# Patient Record
Sex: Female | Born: 1980 | Race: White | Hispanic: No | Marital: Married | Smoking: Never smoker
Health system: Southern US, Community
[De-identification: ages and names within clinical notes are randomized; demographics above are authoritative.]

## PROBLEM LIST (undated history)

## (undated) ENCOUNTER — Inpatient Hospital Stay (HOSPITAL_COMMUNITY): Payer: Self-pay

## (undated) DIAGNOSIS — Z8619 Personal history of other infectious and parasitic diseases: Secondary | ICD-10-CM

## (undated) DIAGNOSIS — I10 Essential (primary) hypertension: Secondary | ICD-10-CM

## (undated) SURGERY — Surgical Case
Anesthesia: *Unknown

---

## 2005-03-26 ENCOUNTER — Other Ambulatory Visit: Admission: RE | Admit: 2005-03-26 | Discharge: 2005-03-26 | Payer: Self-pay | Admitting: Obstetrics and Gynecology

## 2011-03-12 LAB — OB RESULTS CONSOLE RUBELLA ANTIBODY, IGM: Rubella: IMMUNE

## 2011-03-12 LAB — OB RESULTS CONSOLE HIV ANTIBODY (ROUTINE TESTING): HIV: NONREACTIVE

## 2011-03-12 LAB — OB RESULTS CONSOLE ABO/RH: RH Type: POSITIVE

## 2011-03-12 LAB — OB RESULTS CONSOLE RPR: RPR: NONREACTIVE

## 2011-08-26 ENCOUNTER — Inpatient Hospital Stay (HOSPITAL_COMMUNITY)
Admission: AD | Admit: 2011-08-26 | Discharge: 2011-08-26 | Disposition: A | Discharge status: Home / Self Care | Payer: BC Managed Care – PPO | Source: Ambulatory Visit | Attending: Obstetrics and Gynecology | Admitting: Obstetrics and Gynecology

## 2011-08-26 ENCOUNTER — Encounter (HOSPITAL_COMMUNITY): Payer: Self-pay | Admitting: *Deleted

## 2011-08-26 DIAGNOSIS — O139 Gestational [pregnancy-induced] hypertension without significant proteinuria, unspecified trimester: Secondary | ICD-10-CM

## 2011-08-26 DIAGNOSIS — IMO0002 Reserved for concepts with insufficient information to code with codable children: Secondary | ICD-10-CM

## 2011-08-26 DIAGNOSIS — R42 Dizziness and giddiness: Secondary | ICD-10-CM | POA: Insufficient documentation

## 2011-08-26 DIAGNOSIS — H538 Other visual disturbances: Secondary | ICD-10-CM | POA: Insufficient documentation

## 2011-08-26 DIAGNOSIS — R51 Headache: Secondary | ICD-10-CM | POA: Insufficient documentation

## 2011-08-26 HISTORY — DX: Essential (primary) hypertension: I10

## 2011-08-26 LAB — GLUCOSE, CAPILLARY: Glucose-Capillary: 86 mg/dL (ref 70–99)

## 2011-08-26 LAB — COMPREHENSIVE METABOLIC PANEL
Alkaline Phosphatase: 132 U/L — ABNORMAL HIGH (ref 39–117)
BUN: 10 mg/dL (ref 6–23)
Chloride: 102 mEq/L (ref 96–112)
GFR calc Af Amer: >90 mL/min (ref >90)
Glucose, Bld: 77 mg/dL (ref 70–99)
Potassium: 3.8 mEq/L (ref 3.5–5.1)
Total Bilirubin: 0.4 mg/dL (ref 0.3–1.2)

## 2011-08-26 LAB — URINALYSIS, ROUTINE W REFLEX MICROSCOPIC
Glucose, UA: NEGATIVE mg/dL
Hgb urine dipstick: NEGATIVE
Specific Gravity, Urine: 1.01 (ref 1.005–1.030)

## 2011-08-26 LAB — URIC ACID: Uric Acid, Serum: 7.4 mg/dL — ABNORMAL HIGH (ref 2.4–7.0)

## 2011-08-26 LAB — CBC
HCT: 33.3 % — ABNORMAL LOW (ref 36.0–46.0)
Hemoglobin: 11.3 g/dL — ABNORMAL LOW (ref 12.0–15.0)
WBC: 7.8 10*3/uL (ref 4.0–10.5)

## 2011-08-26 MED ORDER — ACETAMINOPHEN 325 MG PO TABS
650.0000 mg | ORAL_TABLET | Freq: Once | ORAL | Status: AC
  Administered 2011-08-26: 650 mg via ORAL
  Filled 2011-08-26: qty 2

## 2011-08-26 NOTE — MAU Note (Signed)
BP was 140/100 when checked by EMS.  Is on BP medicine- last taken 05/27 in the afternoon.

## 2011-08-26 NOTE — Discharge Instructions (Signed)
Hypertension During Pregnancy Hypertension is also called high blood pressure. Blood pressure moves blood in your body. Sometimes, the force that moves the blood becomes too strong. When you are pregnant, this condition should be watched carefully. It can cause problems for you and your baby. HOME CARE   Make and keep all of your doctor visits.   Take medicine as told by your doctor. Tell your doctor about all medicines you take.   Eat very little salt.   Exercise regularly.   Do not drink alcohol.   Do not smoke.   Do not have drinks with caffeine.   Lie on your left side when resting.  GET HELP RIGHT AWAY IF:  You have bad belly (abdominal) pain.   You have sudden puffiness (swelling) in the hands, ankles, or face.   You gain 4 pounds (1.8 kilograms) or more in 1 week.   You throw up (vomit) repeatedly.   You have bleeding from the vagina.   You do not feel the baby moving as much.   You have a headache.   You have blurred or double vision.   You have muscle twitching or spasms.   You have shortness of breath.   You have blue fingernails and lips.   You have blood in your pee (urine).  MAKE SURE YOU:  Understand these instructions.   Will watch your condition.   Will get help right away if you are not doing well.  Document Released: 04/19/2010 Document Revised: 03/06/2011 Document Reviewed: 11/01/2010 [employer] Patient Information 2012 [employer]. 

## 2011-08-26 NOTE — MAU Note (Addendum)
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  EMS called BS was 79.  Instructed to call MD.  Has a really bad headache, rt hand and lip felt numb- no longer do. No longer feeling dizzy,  Vision is much better.  At this point mainly the headache. Denies hx of diabetes or hypoglycemia.

## 2011-08-26 NOTE — MAU Provider Note (Addendum)
History     CSN: [id number]  Arrival date and time: 08/26/11 1239   None     Chief Complaint  Patient presents with  . Headache  . Dizziness  . Hypoglycemia   HPI This is a 31 year old G2 P0 010 at 33 weeks and one-day who presents the MAU with headache, blurred vision, elevated blood pressure that occurred around 10:30 this morning. EMS was called and she was found to have a blood pressure of 140/100 the blood sugar of 79. Her doctor's office was subsequently called and the patient was sent to the MAU for evaluation. Since being here, the patient's headache has continued, although her blurred vision has resolved. She reports good fetal activity and denies vaginal bleeding, vaginal discharge, leaking fluid, contractions, nausea, abdominal pain. Tylenol improved a headache. No provoking factors.  OB History    Grav Para Term Preterm Abortions TAB SAB Ect Mult Living   2         0      Past Medical History  Diagnosis Date  . Hypertension     History reviewed. No pertinent past surgical history.  Family History  Problem Relation Age of Onset  . Hypertension Mother   . Hypertension Father   . Cancer Father   . Heart disease Maternal Grandmother   . Heart disease Paternal Grandfather     History  Substance Use Topics  . Smoking status: Never Smoker   . Smokeless tobacco: Never Used  . Alcohol Use: No    Allergies: No Known Allergies  Prescriptions prior to admission  Medication Sig Dispense Refill  . NIFEdipine (PROCARDIA XL/ADALAT-CC) 60 MG 24 hr tablet Take 60 mg by mouth daily.      . Prenatal Vit-Fe Fumarate-FA (PRENATAL MULTIVITAMIN) TABS Take 1 tablet by mouth daily.        Review of Systems  All other systems reviewed and are negative.   Physical Exam   Blood pressure 145/96, pulse 64, temperature 98.7 F (37.1 C), temperature source Oral, resp. rate 20, height 5\' 4"  (1.626 m), weight 82.736 kg (182 lb 6.4 oz).  Physical Exam  Constitutional: She is  oriented to person, place, and time. She appears well-developed and well-nourished.  HENT:  Head: Normocephalic and atraumatic.  Eyes: EOM are normal.  Neck: Neck supple.  GI: Soft. Bowel sounds are normal. She exhibits no distension and no mass. There is no tenderness. There is no rebound and no guarding.       Size equal dates.  Neurological: She is alert and oriented to person, place, and time. No cranial nerve deficit.  Psychiatric: She has a normal mood and affect. Her behavior is normal. Judgment and thought content normal.   Results for orders placed during the hospital encounter of 08/26/11 (from the past 24 hour(s))  URINALYSIS, ROUTINE W REFLEX MICROSCOPIC     Status: Abnormal   Collection Time   08/26/11 12:40 PM      Component Value Range   Color, Urine YELLOW  YELLOW    APPearance CLEAR  CLEAR    Specific Gravity, Urine 1.010  1.005 - 1.030    pH 6.0  5.0 - 8.0    Glucose, UA NEGATIVE  NEGATIVE (mg/dL)   Hgb urine dipstick NEGATIVE  NEGATIVE    Bilirubin Urine NEGATIVE  NEGATIVE    Ketones, ur 15 (*) NEGATIVE (mg/dL)   Protein, ur NEGATIVE  NEGATIVE (mg/dL)   Urobilinogen, UA 0.2  0.0 - 1.0 (mg/dL)   Nitrite NEGATIVE  NEGATIVE    Leukocytes, UA NEGATIVE  NEGATIVE   GLUCOSE, CAPILLARY     Status: Normal   Collection Time   08/26/11 12:51 PM      Component Value Range   Glucose-Capillary 86  70 - 99 (mg/dL)  CBC     Status: Abnormal   Collection Time   08/26/11  1:33 PM      Component Value Range   WBC 7.8  4.0 - 10.5 (K/uL)   RBC 3.82 (*) 3.87 - 5.11 (MIL/uL)   Hemoglobin 11.3 (*) 12.0 - 15.0 (g/dL)   HCT 47.8 (*) 29.5 - 46.0 (%)   MCV 87.2  78.0 - 100.0 (fL)   MCH 29.6  26.0 - 34.0 (pg)   MCHC 33.9  30.0 - 36.0 (g/dL)   RDW 62.1  30.8 - 65.7 (%)   Platelets 224  150 - 400 (K/uL)  COMPREHENSIVE METABOLIC PANEL     Status: Abnormal   Collection Time   08/26/11  1:33 PM      Component Value Range   Sodium 134 (*) 135 - 145 (mEq/L)   Potassium 3.8  3.5 - 5.1  (mEq/L)   Chloride 102  96 - 112 (mEq/L)   CO2 22  19 - 32 (mEq/L)   Glucose, Bld 77  70 - 99 (mg/dL)   BUN 10  6 - 23 (mg/dL)   Creatinine, Ser 8.46  0.50 - 1.10 (mg/dL)   Calcium 9.0  8.4 - 96.2 (mg/dL)   Total Protein 5.7 (*) 6.0 - 8.3 (g/dL)   Albumin 2.6 (*) 3.5 - 5.2 (g/dL)   AST 18  0 - 37 (U/L)   ALT 11  0 - 35 (U/L)   Alkaline Phosphatase 132 (*) 39 - 117 (U/L)   Total Bilirubin 0.4  0.3 - 1.2 (mg/dL)   GFR calc non Af Amer >90  >90 (mL/min)   GFR calc Af Amer >90  >90 (mL/min)  URIC ACID     Status: Abnormal   Collection Time   08/26/11  1:33 PM      Component Value Range   Uric Acid, Serum 7.4 (*) 2.4 - 7.0 (mg/dL)  LACTATE DEHYDROGENASE     Status: Normal   Collection Time   08/26/11  1:33 PM      Component Value Range   LDH 185  94 - 250 (U/L)    MAU Course  Procedures NST: Category 1 tracing with baseline rate in the 130s. Multiple accelerations seen. No contractions evident.  MDM Uric acid elevated at 7.4. All the values normal. Some elevated concern for preeclampsia.   Assessment and Plan  #1 pregnancy-induced hypertension With elevated concern for eclampsia, patient sent home with 24-hour urine. Patient to followup with private physician's office on Friday. Call Dr. Henderson Cloud discussed plan. Patient to call if any further headaches, blurred vision, scotomata.  Courtnei Ruddell JEHIEL 08/26/2011, 1:38 PM

## 2011-09-02 ENCOUNTER — Other Ambulatory Visit (HOSPITAL_COMMUNITY): Payer: Self-pay | Admitting: Obstetrics and Gynecology

## 2011-09-02 DIAGNOSIS — I10 Essential (primary) hypertension: Secondary | ICD-10-CM

## 2011-09-10 ENCOUNTER — Other Ambulatory Visit (HOSPITAL_COMMUNITY): Payer: Self-pay | Admitting: Obstetrics & Gynecology

## 2011-09-10 ENCOUNTER — Ambulatory Visit (HOSPITAL_COMMUNITY)
Admission: RE | Admit: 2011-09-10 | Discharge: 2011-09-10 | Disposition: A | Discharge status: Home / Self Care | Payer: BC Managed Care – PPO | Source: Ambulatory Visit | Attending: Obstetrics and Gynecology | Admitting: Obstetrics and Gynecology

## 2011-09-10 DIAGNOSIS — I10 Essential (primary) hypertension: Secondary | ICD-10-CM

## 2011-09-10 DIAGNOSIS — O10019 Pre-existing essential hypertension complicating pregnancy, unspecified trimester: Secondary | ICD-10-CM | POA: Insufficient documentation

## 2011-09-10 DIAGNOSIS — Z3689 Encounter for other specified antenatal screening: Secondary | ICD-10-CM | POA: Insufficient documentation

## 2011-09-11 ENCOUNTER — Other Ambulatory Visit (HOSPITAL_COMMUNITY): Payer: Self-pay | Admitting: Obstetrics & Gynecology

## 2011-09-11 ENCOUNTER — Ambulatory Visit (HOSPITAL_COMMUNITY)
Admission: RE | Admit: 2011-09-11 | Discharge: 2011-09-11 | Disposition: A | Discharge status: Home / Self Care | Payer: BC Managed Care – PPO | Source: Ambulatory Visit | Attending: Obstetrics & Gynecology | Admitting: Obstetrics & Gynecology

## 2011-09-11 DIAGNOSIS — O4100X0 Oligohydramnios, unspecified trimester, not applicable or unspecified: Secondary | ICD-10-CM | POA: Insufficient documentation

## 2011-09-11 DIAGNOSIS — I10 Essential (primary) hypertension: Secondary | ICD-10-CM

## 2011-09-11 DIAGNOSIS — O10019 Pre-existing essential hypertension complicating pregnancy, unspecified trimester: Secondary | ICD-10-CM | POA: Insufficient documentation

## 2011-09-22 LAB — OB RESULTS CONSOLE GBS: GBS: NEGATIVE

## 2011-09-24 ENCOUNTER — Inpatient Hospital Stay (HOSPITAL_COMMUNITY)
Admission: AD | Admit: 2011-09-24 | Discharge: 2011-09-24 | Disposition: A | Discharge status: Home / Self Care | Payer: BC Managed Care – PPO | Source: Ambulatory Visit | Attending: Obstetrics and Gynecology | Admitting: Obstetrics and Gynecology

## 2011-09-24 ENCOUNTER — Encounter (HOSPITAL_COMMUNITY): Payer: Self-pay | Admitting: *Deleted

## 2011-09-24 DIAGNOSIS — O10019 Pre-existing essential hypertension complicating pregnancy, unspecified trimester: Secondary | ICD-10-CM | POA: Insufficient documentation

## 2011-09-24 DIAGNOSIS — O10919 Unspecified pre-existing hypertension complicating pregnancy, unspecified trimester: Secondary | ICD-10-CM

## 2011-09-24 LAB — CBC
HCT: 35.5 % — ABNORMAL LOW (ref 36.0–46.0)
MCH: 28.9 pg (ref 26.0–34.0)
MCV: 84.9 fL (ref 78.0–100.0)
Platelets: 213 10*3/uL (ref 150–400)
RBC: 4.18 MIL/uL (ref 3.87–5.11)

## 2011-09-24 LAB — URINALYSIS, ROUTINE W REFLEX MICROSCOPIC
Bilirubin Urine: NEGATIVE
Hgb urine dipstick: NEGATIVE
Protein, ur: NEGATIVE mg/dL
Specific Gravity, Urine: <1.005 — ABNORMAL LOW (ref 1.005–1.030)
Urobilinogen, UA: 0.2 mg/dL (ref 0.0–1.0)

## 2011-09-24 LAB — COMPREHENSIVE METABOLIC PANEL
AST: 17 U/L (ref 0–37)
BUN: 8 mg/dL (ref 6–23)
CO2: 22 mEq/L (ref 19–32)
Calcium: 9.2 mg/dL (ref 8.4–10.5)
Creatinine, Ser: 0.7 mg/dL (ref 0.50–1.10)
GFR calc Af Amer: >90 mL/min (ref >90)
GFR calc non Af Amer: >90 mL/min (ref >90)
Glucose, Bld: 74 mg/dL (ref 70–99)

## 2011-09-24 NOTE — MAU Note (Signed)
Pt states she woke up with a headache and had blurring of her vision of her side eye.

## 2011-09-24 NOTE — MAU Provider Note (Addendum)
  History   31 yo G2P0010 @ 37+2 who presents to MAU for evaluation of elevated BPs. The patients pregnancy has been complicated by well controlled chronic hypertension.  Up to this point she has had no significant elevations of her BPs. She woke up this morning and experienced blurred vision. Her BP was elevated at 160/108. She continues to have headaches, worsening  Epigastric/GERD symptoms, and upper back pain.  Blurred vision has resolved.  CSN: [id number]  Arrival date and time: 09/24/11 1054   First Provider Initiated Contact with Patient 09/24/11 1126      Chief Complaint  Patient presents with  . Headache  . Hypertension   HPI: as above   Past Medical History  Diagnosis Date  . Hypertension     History reviewed. No pertinent past surgical history.  Family History  Problem Relation Age of Onset  . Hypertension Mother   . Hypertension Father   . Cancer Father   . Heart disease Maternal Grandmother   . Heart disease Paternal Grandfather     History  Substance Use Topics  . Smoking status: Never Smoker   . Smokeless tobacco: Never Used  . Alcohol Use: No    Allergies: No Known Allergies  Prescriptions prior to admission  Medication Sig Dispense Refill  . calcium carbonate (TUMS - DOSED IN MG ELEMENTAL CALCIUM) 500 MG chewable tablet Chew 2 tablets by mouth daily as needed. For heartburn      . NIFEdipine (PROCARDIA XL/ADALAT-CC) 60 MG 24 hr tablet Take 60 mg by mouth daily.      . Prenatal Vit-Fe Fumarate-FA (PRENATAL MULTIVITAMIN) TABS Take 1 tablet by mouth daily.        ROS: as above Physical Exam   There were no vitals taken for this visit.  Physical Exam  AOX3, nad Gravid soft FHT 140-150 reactive Cvx deferred toco quiet   MAU Course  Procedures   Assessment and Plan  1) Pre-eclampsia labs are pending, await results 2) FWB reassuring  Addendum:  Lab work returned normal, UA negative for protein.  BPs 140s/90-100s.  Discussed case with Dr.  Sherrie George of MFM. She concurs with the plan to send the patient home with a 24 hour urine and follow-up in the office on Friday.  She also agrees that adding an additional anti-hypertensive agent is not warrented. Appointment scheduled for Friday 08/26/11 at 10:00  Ziah Leandro H. 09/24/2011, 12:28 PM

## 2011-09-24 NOTE — Discharge Instructions (Signed)

## 2011-09-24 NOTE — MAU Provider Note (Signed)
  History     CSN: [id number]  Arrival date and time: 09/24/11 1054   First Provider Initiated Contact with Patient 09/24/11 1126      Chief Complaint  Patient presents with  . Headache  . Hypertension   HPI Nancy Rice 31 y.o. [redacted]w[redacted]d Comes to MAU today for evalution.  Awakened at 3 am with headache and blurred vision in one eye.  Hx of chronic hypertension on medication.  Took her blood pressure and was 160/108.  Took her medication.  Called the office and was told to come to MAU.  Currently still has headache but no longer has any blurred vision.   OB History    Grav Para Term Preterm Abortions TAB SAB Ect Mult Living   2 0 0 0 0 0 0 0 0 0       Past Medical History  Diagnosis Date  . Hypertension     History reviewed. No pertinent past surgical history.  Family History  Problem Relation Age of Onset  . Hypertension Mother   . Hypertension Father   . Cancer Father   . Heart disease Maternal Grandmother   . Heart disease Paternal Grandfather     History  Substance Use Topics  . Smoking status: Never Smoker   . Smokeless tobacco: Never Used  . Alcohol Use: No    Allergies: No Known Allergies  Prescriptions prior to admission  Medication Sig Dispense Refill  . calcium carbonate (TUMS - DOSED IN MG ELEMENTAL CALCIUM) 500 MG chewable tablet Chew 2 tablets by mouth daily as needed. For heartburn      . NIFEdipine (PROCARDIA XL/ADALAT-CC) 60 MG 24 hr tablet Take 60 mg by mouth daily.      . Prenatal Vit-Fe Fumarate-FA (PRENATAL MULTIVITAMIN) TABS Take 1 tablet by mouth daily.        Review of Systems  HENT:       No blurred vision  Gastrointestinal: Negative for abdominal pain.  Musculoskeletal:       No ankle edema  Neurological: Positive for headaches.   Physical Exam   There were no vitals taken for this visit.  Physical Exam  Nursing note and vitals reviewed. Constitutional: She is oriented to person, place, and time. She appears well-developed  and well-nourished.  HENT:  Head: Normocephalic.  Eyes: EOM are normal.  Neck: Neck supple.  GI:       On fetal monitor. FHT baseline 135.  15x15 accels noted. Reactive strip.  Musculoskeletal: Normal range of motion.  Neurological: She is alert and oriented to person, place, and time.  Skin: Skin is warm and dry.  Psychiatric: She has a normal mood and affect.    MAU Course  Procedures  MDM 1135  Consult with Dr. Tenny Craw re: plan of care  Assessment and Plan  Care assumed by Dr. Ileene Musa 09/24/2011, 11:34 AM

## 2011-09-24 NOTE — MAU Note (Signed)
Woke up this morning with a migraine, checked BP and it had shot up.  Denies hx of elevated BP

## 2011-10-01 ENCOUNTER — Other Ambulatory Visit: Payer: Self-pay | Admitting: Obstetrics and Gynecology

## 2011-10-03 ENCOUNTER — Inpatient Hospital Stay (HOSPITAL_COMMUNITY)
Admission: AD | Admit: 2011-10-03 | Discharge: 2011-10-03 | Disposition: A | Discharge status: Home / Self Care | Payer: BC Managed Care – PPO | Source: Ambulatory Visit | Attending: Obstetrics and Gynecology | Admitting: Obstetrics and Gynecology

## 2011-10-03 DIAGNOSIS — O368390 Maternal care for abnormalities of the fetal heart rate or rhythm, unspecified trimester, not applicable or unspecified: Secondary | ICD-10-CM | POA: Insufficient documentation

## 2011-10-06 ENCOUNTER — Encounter (HOSPITAL_COMMUNITY): Payer: Self-pay | Admitting: *Deleted

## 2011-10-06 ENCOUNTER — Inpatient Hospital Stay (HOSPITAL_COMMUNITY)
Admission: AD | Admit: 2011-10-06 | Discharge: 2011-10-10 | DRG: 371 | Disposition: A | Discharge status: Home / Self Care | Payer: BC Managed Care – PPO | Source: Ambulatory Visit | Attending: Obstetrics and Gynecology | Admitting: Obstetrics and Gynecology

## 2011-10-06 DIAGNOSIS — O1002 Pre-existing essential hypertension complicating childbirth: Secondary | ICD-10-CM | POA: Diagnosis present

## 2011-10-06 DIAGNOSIS — O4590 Premature separation of placenta, unspecified, unspecified trimester: Secondary | ICD-10-CM | POA: Diagnosis present

## 2011-10-06 LAB — COMPREHENSIVE METABOLIC PANEL
AST: 41 U/L — ABNORMAL HIGH (ref 0–37)
CO2: 20 mEq/L (ref 19–32)
Calcium: 8.8 mg/dL (ref 8.4–10.5)
Creatinine, Ser: 0.76 mg/dL (ref 0.50–1.10)
GFR calc Af Amer: >90 mL/min (ref >90)
GFR calc non Af Amer: >90 mL/min (ref >90)
Total Protein: 5.8 g/dL — ABNORMAL LOW (ref 6.0–8.3)

## 2011-10-06 LAB — CBC
HCT: 34.9 % — ABNORMAL LOW (ref 36.0–46.0)
Hemoglobin: 11.8 g/dL — ABNORMAL LOW (ref 12.0–15.0)
MCH: 29 pg (ref 26.0–34.0)
MCHC: 33.8 g/dL (ref 30.0–36.0)
MCV: 85.7 fL (ref 78.0–100.0)
Platelets: 185 10*3/uL (ref 150–400)
RBC: 4.07 MIL/uL (ref 3.87–5.11)
RDW: 13.5 % (ref 11.5–15.5)
WBC: 8 10*3/uL (ref 4.0–10.5)

## 2011-10-06 MED ORDER — HYDRALAZINE HCL 20 MG/ML IJ SOLN
10.0000 mg | INTRAMUSCULAR | Status: DC | PRN
  Administered 2011-10-06: 10 mg via INTRAVENOUS
  Filled 2011-10-06: qty 1

## 2011-10-06 MED ORDER — LIDOCAINE HCL (PF) 1 % IJ SOLN: 30.0000 mL | INTRAMUSCULAR | Status: DC | PRN

## 2011-10-06 MED ORDER — NIFEDIPINE ER 60 MG PO TB24
60.0000 mg | ORAL_TABLET | Freq: Every day | ORAL | Status: DC
  Administered 2011-10-07 – 2011-10-10 (×4): 60 mg via ORAL
  Filled 2011-10-06 (×4): qty 1

## 2011-10-06 MED ORDER — IBUPROFEN 600 MG PO TABS: 600.0000 mg | ORAL_TABLET | Freq: Four times a day (QID) | ORAL | Status: DC | PRN

## 2011-10-06 MED ORDER — OXYTOCIN BOLUS FROM INFUSION
250.0000 mL | Freq: Once | INTRAVENOUS | Status: DC
  Filled 2011-10-06: qty 500

## 2011-10-06 MED ORDER — ONDANSETRON HCL 4 MG/2ML IJ SOLN: 4.0000 mg | Freq: Four times a day (QID) | INTRAMUSCULAR | Status: DC | PRN

## 2011-10-06 MED ORDER — ACETAMINOPHEN 325 MG PO TABS: 650.0000 mg | ORAL_TABLET | ORAL | Status: DC | PRN

## 2011-10-06 MED ORDER — OXYTOCIN 40 UNITS IN LACTATED RINGERS INFUSION - SIMPLE MED: 62.5000 mL/h | Freq: Once | INTRAVENOUS | Status: DC

## 2011-10-06 MED ORDER — LACTATED RINGERS IV SOLN: 500.0000 mL | INTRAVENOUS | Status: DC | PRN

## 2011-10-06 MED ORDER — FLEET ENEMA 7-19 GM/118ML RE ENEM: 1.0000 | ENEMA | RECTAL | Status: DC | PRN

## 2011-10-06 MED ORDER — MISOPROSTOL 25 MCG QUARTER TABLET
25.0000 ug | ORAL_TABLET | ORAL | Status: DC | PRN
  Administered 2011-10-06 (×2): 25 ug via VAGINAL
  Filled 2011-10-06 (×2): qty 0.25

## 2011-10-06 MED ORDER — CITRIC ACID-SODIUM CITRATE 334-500 MG/5ML PO SOLN
30.0000 mL | ORAL | Status: DC | PRN
  Administered 2011-10-07 (×2): 30 mL via ORAL
  Filled 2011-10-06 (×2): qty 15

## 2011-10-06 MED ORDER — OXYCODONE-ACETAMINOPHEN 5-325 MG PO TABS: 1.0000 | ORAL_TABLET | ORAL | Status: DC | PRN

## 2011-10-06 MED ORDER — LACTATED RINGERS IV SOLN
INTRAVENOUS | Status: DC
  Administered 2011-10-06: 125 mL/h via INTRAVENOUS
  Administered 2011-10-07 (×5): via INTRAVENOUS

## 2011-10-06 MED ORDER — TERBUTALINE SULFATE 1 MG/ML IJ SOLN: 0.2500 mg | Freq: Once | INTRAMUSCULAR | Status: AC | PRN

## 2011-10-06 NOTE — Progress Notes (Signed)
Pt comfortable, not feeling contractions.  No complaints. FHT 130 mod var, +accels TOCO no ctx, some irritability SVE def Severe range BP x 2 Labetalol 20mg  IV ordered x 1 for any additional severe range BP AST slightly elevated at 41, labs otherwise unremarkable, repeat CBC and CMP in am Continue cytotec cervical ripening

## 2011-10-06 NOTE — H&P (Signed)
31 y.o. 104w0d  G1P0000 comes in for induction secondary to [facility].  Otherwise has good fetal movement and no bleeding.  Most recent US last week 5#11 (12%).  Past Medical History  Diagnosis Date  . Hypertension    History reviewed. No pertinent past surgical history.  OB History    Grav Para Term Preterm Abortions TAB SAB Ect Mult Living   1 0 0 0 0 0 0 0 0 0      # Outc Date GA Lbr Len/2nd Wgt Sex Del Anes PTL Lv   1 CUR               History   Social History  . Marital Status: Married    Spouse Name: N/A    Number of Children: N/A  . Years of Education: N/A   Occupational History  . Not on file.   Social History Main Topics  . Smoking status: Never Smoker   . Smokeless tobacco: Never Used  . Alcohol Use: No  . Drug Use: No  . Sexually Active: Yes    Birth Control/ Protection: None   Other Topics Concern  . Not on file   Social History Narrative  . No narrative on file   Review of patient's allergies indicates no known allergies.   Prenatal Course:  CHTN on Procardia XL 60mg  qd - well controlled. Clomid pregnancy  Filed Vitals:   10/06/11 1805  BP: 145/100  Pulse: 67  Temp: 97.6 F (36.4 C)  Resp: 20     Lungs/Cor:  NAD Abdomen:  soft, gravid Ex:  no cords, erythema SVE:  1-2/50/-3/post/firm FHTs:  130, good STV, NST R Toco:  Rare ctx   A/P   Admit for IOL secondary to [facility] on Procardia  GBS Neg  Cytotec for cervical ripening  Routine care  Sullivan, Luther Parody

## 2011-10-07 ENCOUNTER — Encounter (HOSPITAL_COMMUNITY): Payer: Self-pay | Admitting: *Deleted

## 2011-10-07 ENCOUNTER — Encounter (HOSPITAL_COMMUNITY): Payer: Self-pay | Admitting: Anesthesiology

## 2011-10-07 ENCOUNTER — Encounter (HOSPITAL_COMMUNITY)
Admission: AD | Disposition: A | Discharge status: Home / Self Care | Payer: Self-pay | Source: Ambulatory Visit | Attending: Obstetrics and Gynecology

## 2011-10-07 ENCOUNTER — Inpatient Hospital Stay (HOSPITAL_COMMUNITY): Payer: BC Managed Care – PPO | Admitting: Anesthesiology

## 2011-10-07 LAB — CBC
Hemoglobin: 12.8 g/dL (ref 12.0–15.0)
MCH: 29.6 pg (ref 26.0–34.0)
RBC: 4.33 MIL/uL (ref 3.87–5.11)

## 2011-10-07 LAB — COMPREHENSIVE METABOLIC PANEL
ALT: 9 U/L (ref 0–35)
Alkaline Phosphatase: 171 U/L — ABNORMAL HIGH (ref 39–117)
CO2: 22 mEq/L (ref 19–32)
GFR calc Af Amer: >90 mL/min (ref >90)
GFR calc non Af Amer: >90 mL/min (ref >90)
Glucose, Bld: 83 mg/dL (ref 70–99)
Potassium: 3.7 mEq/L (ref 3.5–5.1)
Sodium: 135 mEq/L (ref 135–145)

## 2011-10-07 LAB — RPR: RPR Ser Ql: NONREACTIVE

## 2011-10-07 SURGERY — Surgical Case
Anesthesia: Epidural | Site: Abdomen | Wound class: Clean Contaminated

## 2011-10-07 MED ORDER — OXYTOCIN 10 UNIT/ML IJ SOLN
INTRAMUSCULAR | Status: AC
  Filled 2011-10-07: qty 3

## 2011-10-07 MED ORDER — CEFAZOLIN SODIUM 1-5 GM-% IV SOLN
INTRAVENOUS | Status: DC | PRN
  Administered 2011-10-07: 2 g via INTRAVENOUS

## 2011-10-07 MED ORDER — NALOXONE HCL 0.4 MG/ML IJ SOLN: 0.4000 mg | INTRAMUSCULAR | Status: DC | PRN

## 2011-10-07 MED ORDER — OXYTOCIN 40 UNITS IN LACTATED RINGERS INFUSION - SIMPLE MED
INTRAVENOUS | Status: DC | PRN
  Administered 2011-10-07: 40 [IU] via INTRAVENOUS

## 2011-10-07 MED ORDER — HYDROMORPHONE HCL PF 1 MG/ML IJ SOLN: 0.2500 mg | INTRAMUSCULAR | Status: DC | PRN

## 2011-10-07 MED ORDER — MENTHOL 3 MG MT LOZG: 1.0000 | LOZENGE | OROMUCOSAL | Status: DC | PRN

## 2011-10-07 MED ORDER — ZOLPIDEM TARTRATE 5 MG PO TABS: 5.0000 mg | ORAL_TABLET | Freq: Every evening | ORAL | Status: DC | PRN

## 2011-10-07 MED ORDER — TETANUS-DIPHTH-ACELL PERTUSSIS 5-2.5-18.5 LF-MCG/0.5 IM SUSP: 0.5000 mL | Freq: Once | INTRAMUSCULAR | Status: DC

## 2011-10-07 MED ORDER — KETOROLAC TROMETHAMINE 60 MG/2ML IM SOLN
60.0000 mg | Freq: Once | INTRAMUSCULAR | Status: AC | PRN
  Administered 2011-10-07: 60 mg via INTRAMUSCULAR

## 2011-10-07 MED ORDER — NALBUPHINE HCL 10 MG/ML IJ SOLN
5.0000 mg | INTRAMUSCULAR | Status: DC | PRN
  Filled 2011-10-07: qty 1

## 2011-10-07 MED ORDER — MORPHINE SULFATE 0.5 MG/ML IJ SOLN
INTRAMUSCULAR | Status: AC
  Filled 2011-10-07: qty 10

## 2011-10-07 MED ORDER — LIDOCAINE HCL (PF) 1 % IJ SOLN
INTRAMUSCULAR | Status: DC | PRN
  Administered 2011-10-07 (×2): 5 mL

## 2011-10-07 MED ORDER — ONDANSETRON HCL 4 MG/2ML IJ SOLN: 4.0000 mg | INTRAMUSCULAR | Status: DC | PRN

## 2011-10-07 MED ORDER — SIMETHICONE 80 MG PO CHEW
80.0000 mg | CHEWABLE_TABLET | Freq: Three times a day (TID) | ORAL | Status: DC
  Administered 2011-10-07 – 2011-10-10 (×9): 80 mg via ORAL

## 2011-10-07 MED ORDER — KETOROLAC TROMETHAMINE 60 MG/2ML IM SOLN
INTRAMUSCULAR | Status: AC
  Administered 2011-10-07: 60 mg via INTRAMUSCULAR
  Filled 2011-10-07: qty 2

## 2011-10-07 MED ORDER — LACTATED RINGERS IV SOLN
INTRAVENOUS | Status: DC | PRN
  Administered 2011-10-07: 11:00:00 via INTRAVENOUS

## 2011-10-07 MED ORDER — METOCLOPRAMIDE HCL 5 MG/ML IJ SOLN: 10.0000 mg | Freq: Three times a day (TID) | INTRAMUSCULAR | Status: DC | PRN

## 2011-10-07 MED ORDER — EPHEDRINE 5 MG/ML INJ
10.0000 mg | INTRAVENOUS | Status: DC | PRN
  Filled 2011-10-07: qty 4

## 2011-10-07 MED ORDER — LANOLIN HYDROUS EX OINT: 1.0000 "application " | TOPICAL_OINTMENT | CUTANEOUS | Status: DC | PRN

## 2011-10-07 MED ORDER — PRENATAL MULTIVITAMIN CH
1.0000 | ORAL_TABLET | Freq: Every day | ORAL | Status: DC
  Administered 2011-10-08 – 2011-10-10 (×3): 1 via ORAL
  Filled 2011-10-07 (×3): qty 1

## 2011-10-07 MED ORDER — SODIUM CHLORIDE 0.9 % IJ SOLN: 3.0000 mL | INTRAMUSCULAR | Status: DC | PRN

## 2011-10-07 MED ORDER — OXYTOCIN 40 UNITS IN LACTATED RINGERS INFUSION - SIMPLE MED
1.0000 m[IU]/min | INTRAVENOUS | Status: DC
  Administered 2011-10-07: 2 m[IU]/min via INTRAVENOUS
  Filled 2011-10-07: qty 1000

## 2011-10-07 MED ORDER — KETOROLAC TROMETHAMINE 30 MG/ML IJ SOLN: 30.0000 mg | Freq: Four times a day (QID) | INTRAMUSCULAR | Status: AC | PRN

## 2011-10-07 MED ORDER — MORPHINE SULFATE (PF) 0.5 MG/ML IJ SOLN
INTRAMUSCULAR | Status: DC | PRN
  Administered 2011-10-07: 3 mg via EPIDURAL

## 2011-10-07 MED ORDER — NALOXONE HCL 0.4 MG/ML IJ SOLN
1.0000 ug/kg/h | INTRAMUSCULAR | Status: DC | PRN
  Filled 2011-10-07: qty 2.5

## 2011-10-07 MED ORDER — DIPHENHYDRAMINE HCL 50 MG/ML IJ SOLN: 25.0000 mg | INTRAMUSCULAR | Status: DC | PRN

## 2011-10-07 MED ORDER — OXYCODONE-ACETAMINOPHEN 5-325 MG PO TABS
1.0000 | ORAL_TABLET | ORAL | Status: DC | PRN
  Administered 2011-10-08: 2 via ORAL
  Administered 2011-10-08 (×3): 1 via ORAL
  Administered 2011-10-09 – 2011-10-10 (×5): 2 via ORAL
  Filled 2011-10-07: qty 2
  Filled 2011-10-07: qty 1
  Filled 2011-10-07 (×2): qty 2
  Filled 2011-10-07: qty 1
  Filled 2011-10-07 (×2): qty 2
  Filled 2011-10-07: qty 1
  Filled 2011-10-07: qty 2

## 2011-10-07 MED ORDER — ONDANSETRON HCL 4 MG/2ML IJ SOLN: 4.0000 mg | Freq: Three times a day (TID) | INTRAMUSCULAR | Status: DC | PRN

## 2011-10-07 MED ORDER — NIFEDIPINE ER 60 MG PO TB24: 60.0000 mg | ORAL_TABLET | Freq: Every day | ORAL | Status: DC

## 2011-10-07 MED ORDER — FENTANYL 2.5 MCG/ML BUPIVACAINE 1/10 % EPIDURAL INFUSION (WH - ANES)
14.0000 mL/h | INTRAMUSCULAR | Status: DC
  Administered 2011-10-07 (×2): 14 mL/h via EPIDURAL
  Filled 2011-10-07 (×2): qty 60

## 2011-10-07 MED ORDER — DIPHENHYDRAMINE HCL 50 MG/ML IJ SOLN: 12.5000 mg | INTRAMUSCULAR | Status: DC | PRN

## 2011-10-07 MED ORDER — EPHEDRINE 5 MG/ML INJ: 10.0000 mg | INTRAVENOUS | Status: DC | PRN

## 2011-10-07 MED ORDER — IBUPROFEN 600 MG PO TABS
600.0000 mg | ORAL_TABLET | Freq: Four times a day (QID) | ORAL | Status: DC
  Administered 2011-10-07 – 2011-10-10 (×10): 600 mg via ORAL
  Filled 2011-10-07 (×5): qty 1

## 2011-10-07 MED ORDER — WITCH HAZEL-GLYCERIN EX PADS: 1.0000 "application " | MEDICATED_PAD | CUTANEOUS | Status: DC | PRN

## 2011-10-07 MED ORDER — MORPHINE SULFATE (PF) 0.5 MG/ML IJ SOLN
INTRAMUSCULAR | Status: DC | PRN
  Administered 2011-10-07: 2 mg via EPIDURAL

## 2011-10-07 MED ORDER — OXYTOCIN 10 UNIT/ML IJ SOLN
INTRAMUSCULAR | Status: AC
  Filled 2011-10-07: qty 1

## 2011-10-07 MED ORDER — DIBUCAINE 1 % RE OINT: 1.0000 "application " | TOPICAL_OINTMENT | RECTAL | Status: DC | PRN

## 2011-10-07 MED ORDER — CEFAZOLIN SODIUM-DEXTROSE 2-3 GM-% IV SOLR
INTRAVENOUS | Status: AC
  Filled 2011-10-07: qty 50

## 2011-10-07 MED ORDER — ONDANSETRON HCL 4 MG/2ML IJ SOLN
INTRAMUSCULAR | Status: AC
  Filled 2011-10-07: qty 2

## 2011-10-07 MED ORDER — DIPHENHYDRAMINE HCL 25 MG PO CAPS: 25.0000 mg | ORAL_CAPSULE | ORAL | Status: DC | PRN

## 2011-10-07 MED ORDER — SENNOSIDES-DOCUSATE SODIUM 8.6-50 MG PO TABS
2.0000 | ORAL_TABLET | Freq: Every day | ORAL | Status: DC
  Administered 2011-10-07 – 2011-10-09 (×3): 2 via ORAL

## 2011-10-07 MED ORDER — DIPHENHYDRAMINE HCL 25 MG PO CAPS: 25.0000 mg | ORAL_CAPSULE | Freq: Four times a day (QID) | ORAL | Status: DC | PRN

## 2011-10-07 MED ORDER — DIPHENHYDRAMINE HCL 50 MG/ML IJ SOLN
12.5000 mg | INTRAMUSCULAR | Status: DC | PRN
Start: 2011-10-07 — End: 2011-10-07

## 2011-10-07 MED ORDER — LACTATED RINGERS IV SOLN: INTRAVENOUS | Status: DC

## 2011-10-07 MED ORDER — SIMETHICONE 80 MG PO CHEW: 80.0000 mg | CHEWABLE_TABLET | ORAL | Status: DC | PRN

## 2011-10-07 MED ORDER — PHENYLEPHRINE 40 MCG/ML (10ML) SYRINGE FOR IV PUSH (FOR BLOOD PRESSURE SUPPORT)
80.0000 ug | PREFILLED_SYRINGE | INTRAVENOUS | Status: DC | PRN
  Filled 2011-10-07: qty 5

## 2011-10-07 MED ORDER — OXYTOCIN 40 UNITS IN LACTATED RINGERS INFUSION - SIMPLE MED: 62.5000 mL/h | INTRAVENOUS | Status: AC

## 2011-10-07 MED ORDER — IBUPROFEN 600 MG PO TABS
600.0000 mg | ORAL_TABLET | Freq: Four times a day (QID) | ORAL | Status: DC | PRN
  Filled 2011-10-07 (×8): qty 1

## 2011-10-07 MED ORDER — PHENYLEPHRINE 40 MCG/ML (10ML) SYRINGE FOR IV PUSH (FOR BLOOD PRESSURE SUPPORT): 80.0000 ug | PREFILLED_SYRINGE | INTRAVENOUS | Status: DC | PRN

## 2011-10-07 MED ORDER — SCOPOLAMINE 1 MG/3DAYS TD PT72
1.0000 | MEDICATED_PATCH | Freq: Once | TRANSDERMAL | Status: AC
  Administered 2011-10-07: 1.5 mg via TRANSDERMAL

## 2011-10-07 MED ORDER — SCOPOLAMINE 1 MG/3DAYS TD PT72
MEDICATED_PATCH | TRANSDERMAL | Status: AC
  Administered 2011-10-07: 1.5 mg via TRANSDERMAL
  Filled 2011-10-07: qty 1

## 2011-10-07 MED ORDER — ONDANSETRON HCL 4 MG PO TABS: 4.0000 mg | ORAL_TABLET | ORAL | Status: DC | PRN

## 2011-10-07 MED ORDER — MEPERIDINE HCL 25 MG/ML IJ SOLN
INTRAMUSCULAR | Status: AC
  Administered 2011-10-07: 6.25 mg via INTRAVENOUS
  Filled 2011-10-07: qty 1

## 2011-10-07 MED ORDER — MEPERIDINE HCL 25 MG/ML IJ SOLN
6.2500 mg | INTRAMUSCULAR | Status: DC | PRN
  Administered 2011-10-07: 6.25 mg via INTRAVENOUS

## 2011-10-07 MED ORDER — LACTATED RINGERS IV SOLN: 500.0000 mL | Freq: Once | INTRAVENOUS | Status: DC

## 2011-10-07 MED ORDER — TERBUTALINE SULFATE 1 MG/ML IJ SOLN: 0.2500 mg | Freq: Once | INTRAMUSCULAR | Status: DC | PRN

## 2011-10-07 MED ORDER — ONDANSETRON HCL 4 MG/2ML IJ SOLN
INTRAMUSCULAR | Status: DC | PRN
  Administered 2011-10-07: 4 mg via INTRAVENOUS

## 2011-10-07 SURGICAL SUPPLY — 29 items
ADH SKN CLS APL DERMABOND .7 (GAUZE/BANDAGES/DRESSINGS) ×1
CLOTH BEACON ORANGE TIMEOUT ST (SAFETY) ×2 IMPLANT
DERMABOND ADVANCED (GAUZE/BANDAGES/DRESSINGS) ×1
DERMABOND ADVANCED .7 DNX12 (GAUZE/BANDAGES/DRESSINGS) IMPLANT
DRESSING TELFA 8X3 (GAUZE/BANDAGES/DRESSINGS) ×1 IMPLANT
ELECT REM PT RETURN 9FT ADLT (ELECTROSURGICAL) ×2
ELECTRODE REM PT RTRN 9FT ADLT (ELECTROSURGICAL) ×1 IMPLANT
EXTRACTOR VACUUM M CUP 4 TUBE (SUCTIONS) IMPLANT
GAUZE SPONGE 4X4 12PLY STRL LF (GAUZE/BANDAGES/DRESSINGS) ×4 IMPLANT
GLOVE BIO SURGEON STRL SZ7 (GLOVE) ×4 IMPLANT
GOWN PREVENTION PLUS LG XLONG (DISPOSABLE) ×4 IMPLANT
KIT ABG SYR 3ML LUER SLIP (SYRINGE) IMPLANT
NDL HYPO 25X5/8 SAFETYGLIDE (NEEDLE) IMPLANT
NEEDLE HYPO 25X5/8 SAFETYGLIDE (NEEDLE) IMPLANT
NS IRRIG 1000ML POUR BTL (IV SOLUTION) ×2 IMPLANT
PACK C SECTION WH (CUSTOM PROCEDURE TRAY) ×2 IMPLANT
PAD ABD 7.5X8 STRL (GAUZE/BANDAGES/DRESSINGS) ×1 IMPLANT
RTRCTR C-SECT PINK 25CM LRG (MISCELLANEOUS) IMPLANT
RTRCTR C-SECT PINK 34CM XLRG (MISCELLANEOUS) IMPLANT
SLEEVE SCD COMPRESS KNEE MED (MISCELLANEOUS) ×1 IMPLANT
STAPLER VISISTAT 35W (STAPLE) IMPLANT
SUT CHROMIC 1 CTX 36 (SUTURE) ×4 IMPLANT
SUT PDS AB 0 CTX 60 (SUTURE) ×2 IMPLANT
SUT VIC AB 2-0 CT1 27 (SUTURE) ×2
SUT VIC AB 2-0 CT1 TAPERPNT 27 (SUTURE) ×1 IMPLANT
SUT VIC AB 4-0 KS 27 (SUTURE) IMPLANT
TOWEL OR 17X24 6PK STRL BLUE (TOWEL DISPOSABLE) ×4 IMPLANT
TRAY FOLEY CATH 14FR (SET/KITS/TRAYS/PACK) ×1 IMPLANT
WATER STERILE IRR 1000ML POUR (IV SOLUTION) ×1 IMPLANT

## 2011-10-07 NOTE — Transfer of Care (Signed)
Immediate Anesthesia Transfer of Care Note  Patient: Nancy Rice  Procedure(s) Performed: Procedure(s) (LRB): CESAREAN SECTION (N/A)  Patient Location: PACU  Anesthesia Type: Epidural  Level of Consciousness: awake  Airway & Oxygen Therapy: Patient Spontanous Breathing  Post-op Assessment: Report given to PACU RN and Post -op Vital signs reviewed and stable  Post vital signs: stable  Complications: No apparent anesthesia complications

## 2011-10-07 NOTE — Progress Notes (Signed)
Patient ID: Nancy Rice, female   DOB: 1980-04-16, 31 y.o.   MRN: [id number]   S: Comfortable after epidural O:  Filed Vitals:   10/07/11 0732 10/07/11 0802 10/07/11 0832 10/07/11 0902  BP: 141/83 135/86 134/87 152/97  Pulse: 49 49 54 64  Temp:    97.8 F (36.6 C)  TempSrc:    Oral  Resp: 16 16 16 16   Height:      Weight:      SpO2:       Cvx 3-4/80/-2 FHT 130  To irregular  1) Cont pit

## 2011-10-07 NOTE — Op Note (Addendum)
Pre-Operative Diagnosis: 1) 39+1 week intrauterine pregnancy 2) Chronic Hypertension 3) Sustained fetal bradycardia Postoperative Diagnosis: Same and placental abruption Procedure: Emergency low transverse cesarean section Surgeon: Dr. Waynard Reeds Assistant: None Operative Findings: Female infant in vertex presentation with 40 % placental abruption. Normal appearing ovaries and tubes. Cord pH 7.14. Apgars 7 and 9 Specimen: Placenta to pathology EBL: Total I/O In: 1300 [I.V.:1300] Out: 1450 [Urine:750; Blood:700]   Procedure:Nancy Rice is an 31 year old gravida 1 para 0 at 64 weeks and 1 days estimated gestational age who presents for cesarean section. The patient was admitted for induction of labor for chronic hypertension.  She was noted to have a deceleration in the fetal heart reate initially to the 80-90s for 4 minutes followed by a further decline to the 70s.  Her cervix was 9 cm but the heart tones were not recovering with scalp stimulation therefore the decision was made to proceed to the OR for delivery.  In the OR Fetal heart tones were still in the 80s and therefore we proceeded with emergency cesarean. Following the appropriate informed verbal consent the patient was brought to the operating room where spinal anesthesia was administered and found to be adequate. She was placed in the dorsal supine position with a leftward tilt. She was prepped and draped in the normal sterile fashion. Scalpel was then used to make a Pfannenstiel skin incision which was carried down to the underlying layers of soft tissue to the fascia. The fascia was incised in the midline and the fascial incision was extended laterally with blunt dissection. The fascial incision was extended with blunt dissection, the rectus muscles were separated in the midline and the abdominal peritoneum was entered bluntly. The rectus muscles were separated in the midline. The bladder blade was inserted and a scalpel was used to make a low  transverse incision on the uterus which was extended bluntly.The fetal vertex was identified, delivered easily through the uterine incision followed by the body. The infant was bulb suctioned on the operative field cried vigorously, cord was clamped and cut and the infant was passed to the waiting neonatologist. Placenta was then delivered spontaneously, the uterus was cleared of all clot and debris. Approximately a 40% placental abruption was noted upon placental delivery The uterine incision was repaired with #1 chromic in running locked fashion followed by a second imbricating layer. Ovaries and tubes were inspected and normal. The uterus was returned to the abdominal cavity the abdominal cavity was cleared of all clot and debris. The abdominal peritoneum was reapproximated with 2-0 Vicryl in a running fashion, the rectus muscles was reapproximated with #1 chromic in a running fashion. The fascia was closed with a looped PDS in a running fashion. The subcutaneous tissue was reapproximated with 2-0 plain gut with interrupted sutures. The skin was closed with 4-0 vicryl in a subcuticular fashion and Dermabond. All sponge lap and needle counts were correct x2. Patient tolerated the procedure well and recovered in stable condition following the procedure.

## 2011-10-07 NOTE — Progress Notes (Signed)
Patient ID: Nancy Rice, female   DOB: [birth year], 31 y.o.   MRN: [id number]   S:Comfortable O: AFVSS FHT 120-130 with variables with contractions cvx 5 toco irregular  Bloody show greater than expected IUPC/FSE placed  A/F:  1) FWB overall reassuring, monitor closely. Fetus responding well to intrauterine resuscitation.

## 2011-10-07 NOTE — Anesthesia Postprocedure Evaluation (Signed)
  Anesthesia Post-op Note  Patient: Nancy Rice  Procedure(s) Performed: Procedure(s) (LRB): CESAREAN SECTION (N/A)  Patient Location: PACU  Anesthesia Type: Epidural  Level of Consciousness: awake, alert  and oriented  Airway and Oxygen Therapy: Patient Spontanous Breathing  Post-op Pain: none  Post-op Assessment: Post-op Vital signs reviewed, Patient's Cardiovascular Status Stable, Respiratory Function Stable, Patent Airway, No signs of Nausea or vomiting, Pain level controlled, No headache and No backache  Post-op Vital Signs: Reviewed and stable  Complications: No apparent anesthesia complications

## 2011-10-07 NOTE — Anesthesia Procedure Notes (Signed)
Epidural Patient location during procedure: OB Start time: 10/07/2011 5:53 AM  Staffing Anesthesiologist: Brayton Caves R Performed by: anesthesiologist   Preanesthetic Checklist Completed: patient identified, site marked, surgical consent, pre-op evaluation, timeout performed, IV checked, risks and benefits discussed and monitors and equipment checked  Epidural Patient position: sitting Prep: site prepped and draped and DuraPrep Patient monitoring: continuous pulse ox and blood pressure Approach: midline Injection technique: LOR air and LOR saline  Needle:  Needle type: Tuohy  Needle gauge: 17 G Needle length: 9 cm Needle insertion depth: 5 cm cm Catheter type: closed end flexible Catheter size: 19 Gauge Catheter at skin depth: 10 cm Test dose: negative  Assessment Events: blood not aspirated, injection not painful, no injection resistance, negative IV test and no paresthesia  Additional Notes Patient identified.  Risk benefits discussed including failed block, incomplete pain control, headache, nerve damage, paralysis, blood pressure changes, nausea, vomiting, reactions to medication both toxic or allergic, and postpartum back pain.  Patient expressed understanding and wished to proceed.  All questions were answered.  Sterile technique used throughout procedure and epidural site dressed with sterile barrier dressing. No paresthesia or other complications noted.The patient did not experience any signs of intravascular injection such as tinnitus or metallic taste in mouth nor signs of intrathecal spread such as rapid motor block. Please see nursing notes for vital signs.

## 2011-10-07 NOTE — Anesthesia Preprocedure Evaluation (Signed)
Anesthesia Evaluation  Patient identified by MRN, date of birth, ID band Patient awake    Reviewed: Allergy & Precautions, H&P , Patient's Chart, lab work & pertinent test results  Airway Mallampati: II TM Distance: >3 FB Neck ROM: full    Dental No notable dental hx.    Pulmonary neg pulmonary ROS,  breath sounds clear to auscultation  Pulmonary exam normal       Cardiovascular hypertension, negative cardio ROS  Rhythm:regular Rate:Normal     Neuro/Psych negative neurological ROS  negative psych ROS   GI/Hepatic negative GI ROS, Neg liver ROS,   Endo/Other  negative endocrine ROS  Renal/GU negative Renal ROS     Musculoskeletal   Abdominal   Peds  Hematology negative hematology ROS (+)   Anesthesia Other Findings   Reproductive/Obstetrics (+) Pregnancy                           Anesthesia Physical Anesthesia Plan  ASA: III  Anesthesia Plan: Epidural   Post-op Pain Management:    Induction:   Airway Management Planned:   Additional Equipment:   Intra-op Plan:   Post-operative Plan:   Informed Consent: I have reviewed the patients History and Physical, chart, labs and discussed the procedure including the risks, benefits and alternatives for the proposed anesthesia with the patient or authorized representative who has indicated his/her understanding and acceptance.     Plan Discussed with:   Anesthesia Plan Comments:         Anesthesia Quick Evaluation  

## 2011-10-08 ENCOUNTER — Encounter (HOSPITAL_COMMUNITY): Payer: Self-pay | Admitting: Obstetrics and Gynecology

## 2011-10-08 LAB — CBC
Platelets: 158 10*3/uL (ref 150–400)
RDW: 13.8 % (ref 11.5–15.5)
WBC: 14.3 10*3/uL — ABNORMAL HIGH (ref 4.0–10.5)

## 2011-10-08 NOTE — Anesthesia Postprocedure Evaluation (Signed)
  Anesthesia Post-op Note  Patient: Nancy Rice  Procedure(s) Performed: Procedure(s) (LRB): CESAREAN SECTION (N/A)  Patient Location: Mother/Baby  Anesthesia Type: Epidural  Level of Consciousness: awake  Airway and Oxygen Therapy: Patient Spontanous Breathing  Post-op Pain: none  Post-op Assessment: Patient's Cardiovascular Status Stable, Respiratory Function Stable, Patent Airway, No signs of Nausea or vomiting, Adequate PO intake, Pain level controlled, No headache, No backache, No residual numbness and No residual motor weakness  Post-op Vital Signs: Reviewed and stable  Complications: No apparent anesthesia complications

## 2011-10-08 NOTE — Addendum Note (Signed)
Addendum  created 10/08/11 1006 by Suella Grove, CRNA   Modules edited:Notes Section

## 2011-10-08 NOTE — Progress Notes (Signed)
Post Op Day 1 Subjective: no complaints  Objective: Blood pressure 113/75, pulse 59, temperature 97.9 F (36.6 C), temperature source Axillary, resp. rate 20, height 5\' 4"  (1.626 m), weight 85.73 kg (189 lb), SpO2 96.00%, unknown if currently breastfeeding.  Physical Exam:  General: alert Lochia: appropriate Uterine Fundus: firm Incision: healing well   Basename 10/08/11 0520 10/07/11 0500  HGB 10.6* 12.8  HCT 30.5* 36.9    Assessment/Plan: Doing well.  Consider early discharge tomorrow.   LOS: 2 days   Talaysia Pinheiro D 10/08/2011, 10:12 AM

## 2011-10-09 NOTE — Progress Notes (Signed)
Subjective: Postpartum Day 2: Cesarean Delivery Patient reports Doing well, pain controlled, bleeding appropriate Objective: Vital signs in last 24 hours: Filed Vitals:   10/08/11 0815 10/08/11 1438 10/08/11 2100 10/09/11 0620  BP: 138/88 130/82 127/75 133/89  Pulse: 66 70 69 54  Temp: 98 F (36.7 C) 97.8 F (36.6 C) 97.9 F (36.6 C) 97.9 F (36.6 C)  TempSrc: Oral Oral  Oral  Resp: 18 18 18 18   Height:      Weight:      SpO2: 95%  98%     Physical Exam:  General: alert, cooperative and appears stated age 31: appropriate Uterine Fundus: firm Incision: healing well   Basename 10/08/11 0520 10/07/11 0500  HGB 10.6* 12.8  HCT 30.5* 36.9    Assessment/Plan: Status post Cesarean section. Doing well postoperatively.  Continue current care. Patient considering early d/c home.  If peds releases baby ok to d/c home HTN well controlled  Ozelle Brubacher H. 10/09/2011, 10:17 AM

## 2011-10-10 MED ORDER — IBUPROFEN 600 MG PO TABS: 600.0000 mg | ORAL_TABLET | Freq: Four times a day (QID) | ORAL | Status: AC | PRN

## 2011-10-10 MED ORDER — OXYCODONE-ACETAMINOPHEN 5-325 MG PO TABS: 1.0000 | ORAL_TABLET | ORAL | Status: AC | PRN

## 2011-10-10 NOTE — Discharge Summary (Signed)
Obstetric Discharge Summary Reason for Admission: induction of labor Prenatal Procedures: none Intrapartum Procedures: cesarean: low cervical, transverse Postpartum Procedures: none Complications-Operative and Postpartum: none Hemoglobin  Date Value Range Status  10/08/2011 10.6* 12.0 - 15.0 g/dL Final     DELTA CHECK NOTED     REPEATED TO VERIFY     HCT  Date Value Range Status  10/08/2011 30.5* 36.0 - 46.0 % Final    Physical Exam:  General: alert and cooperative Lochia: appropriate Uterine Fundus: firm Incision: healing well, no significant drainage, no dehiscence, no significant erythema DVT Evaluation: No evidence of DVT seen on physical exam.  Discharge Diagnoses: Term Pregnancy-delivered  Discharge Information: Date: 10/10/2011 Activity: pelvic rest Diet: routine Medications: PNV, Ibuprofen and Percocet Condition: stable Instructions: refer to practice specific booklet Discharge to: home Follow-up Information    Follow up with Almon Hercules., MD in 1 week.   Contact information:   [address] [place] [address] [phone]          Newborn Data: Live born female  Birth Weight: 5 lb 4.3 oz (2390 g) APGAR: 7, 9  Home with mother.  Philip Aspen 10/10/2011, 9:27 AM

## 2012-07-05 LAB — OB RESULTS CONSOLE GC/CHLAMYDIA
Chlamydia: NEGATIVE
Gonorrhea: NEGATIVE

## 2012-07-05 LAB — OB RESULTS CONSOLE RUBELLA ANTIBODY, IGM: Rubella: IMMUNE

## 2012-07-05 LAB — OB RESULTS CONSOLE HIV ANTIBODY (ROUTINE TESTING): HIV: NONREACTIVE

## 2012-10-31 IMAGING — US US FETAL BPP W/O NONSTRESS
1 series · 13 of 28 positions shown · non-contrast
Comparison: none

[Series 1: us fetal bpp w/o nonstress · non-contrast · 28 acquisitions, 13 frames shown]
[im 2/28]
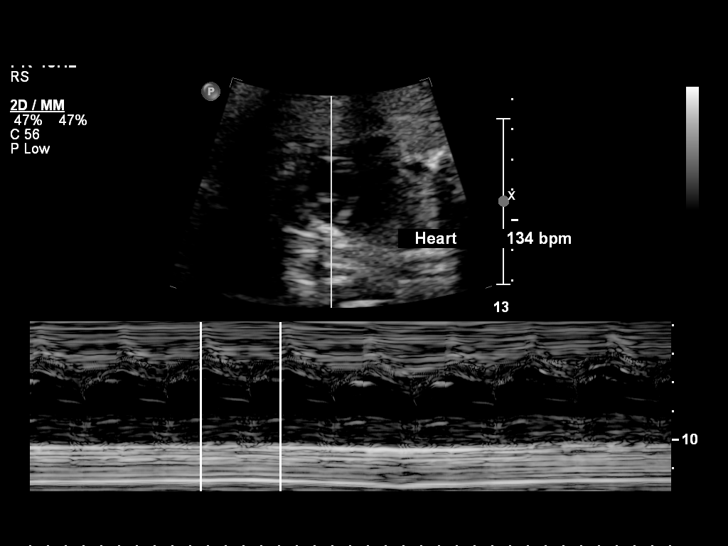
[im 4/28]
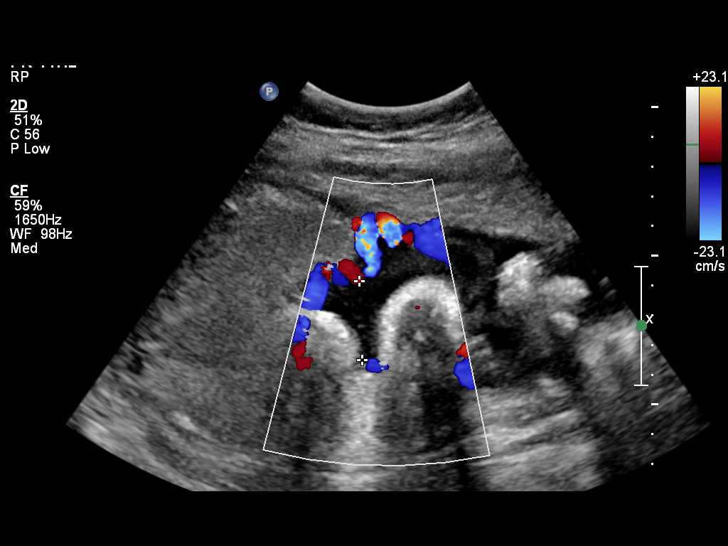
[im 6/28]
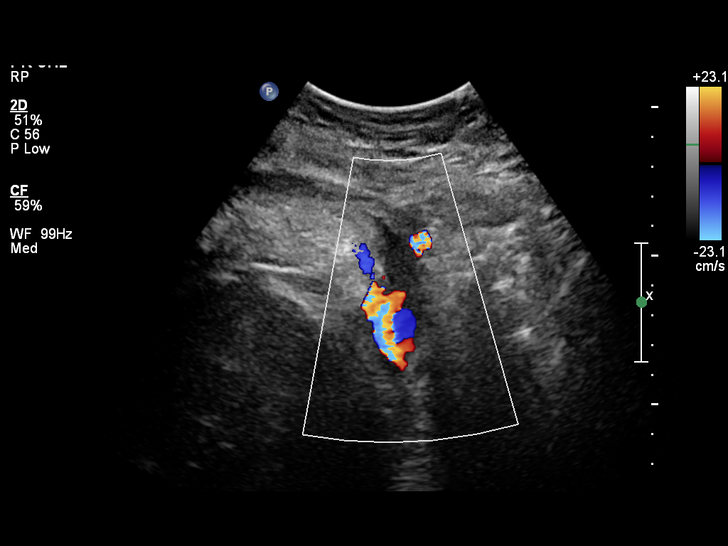
[im 8/28]
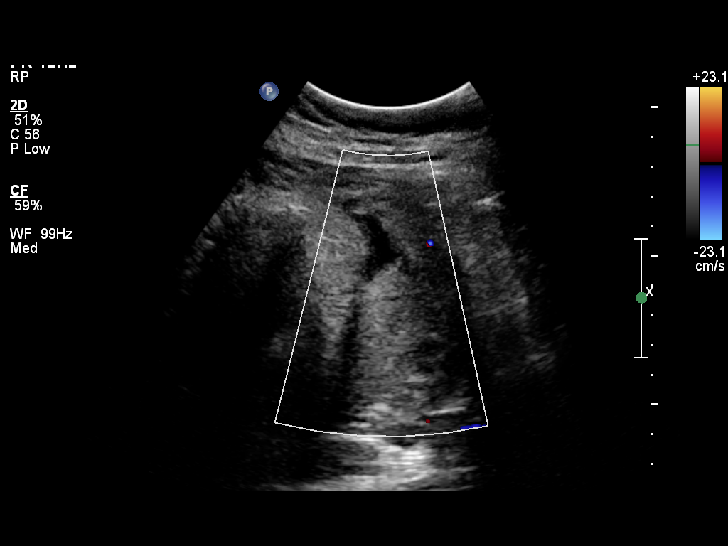
[im 10/28]
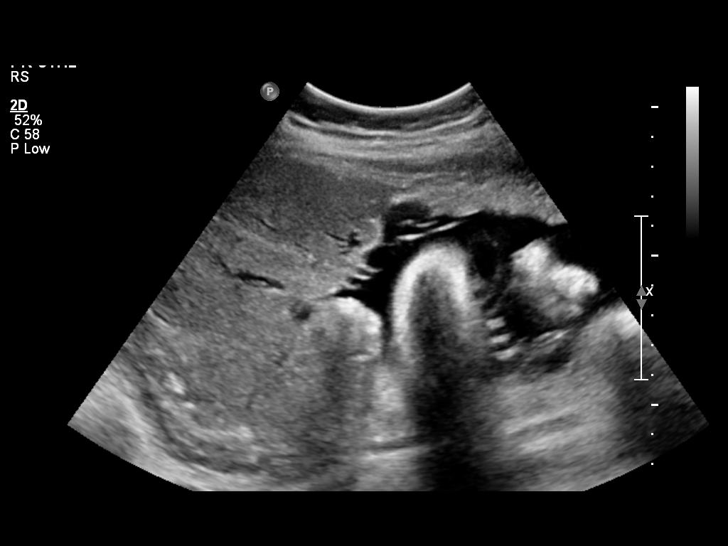
[im 12/28]
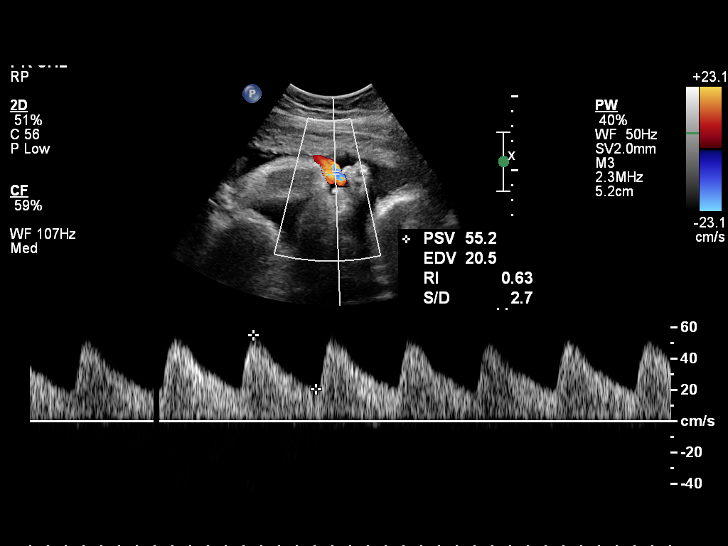
[im 15/28]
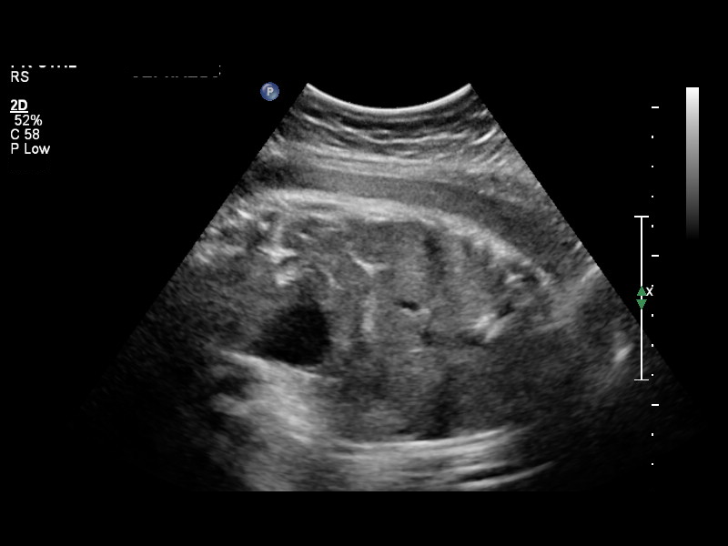
[im 17/28]
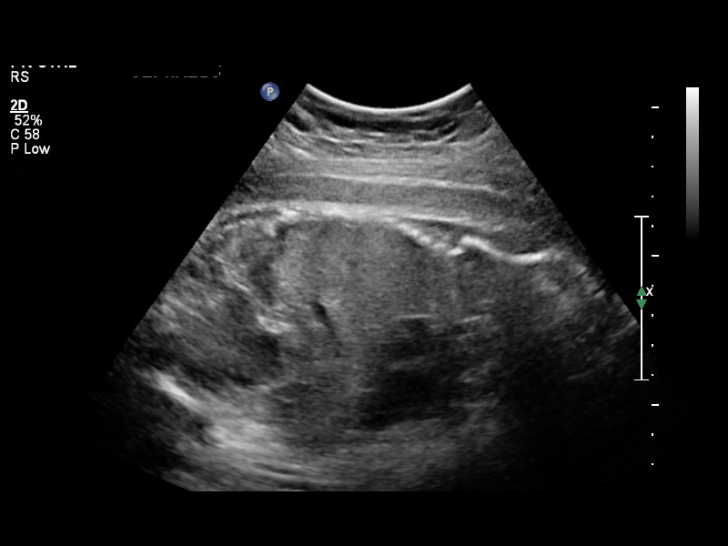
[im 19/28]
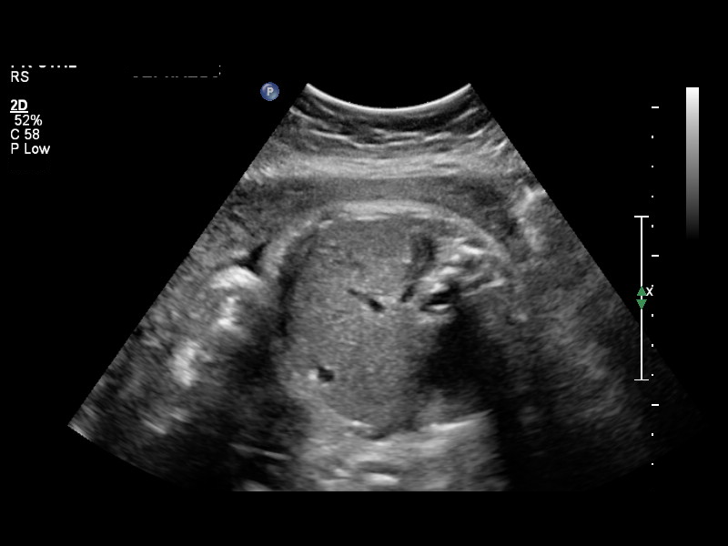
[im 21/28]
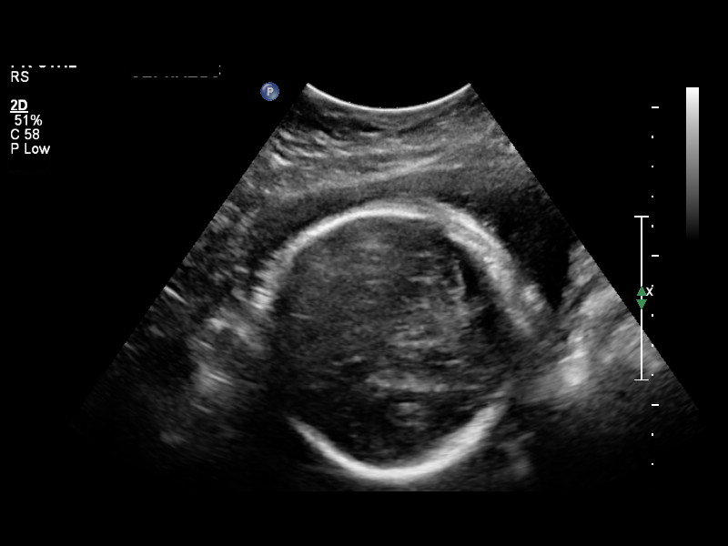
[im 23/28]
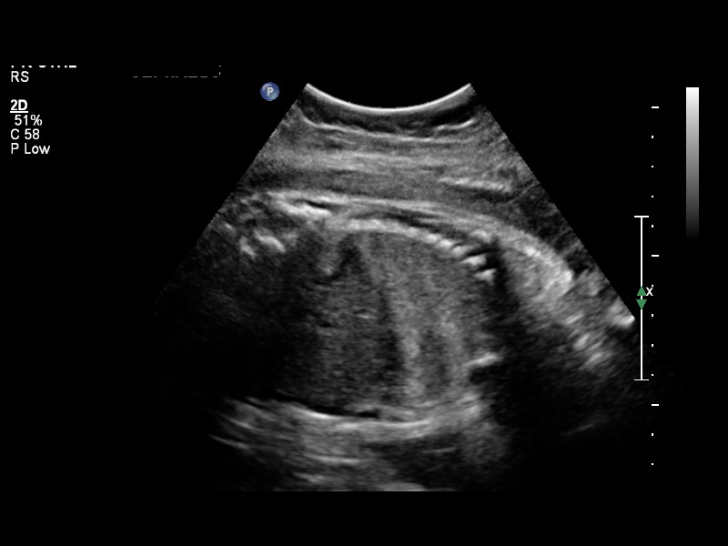
[im 25/28]
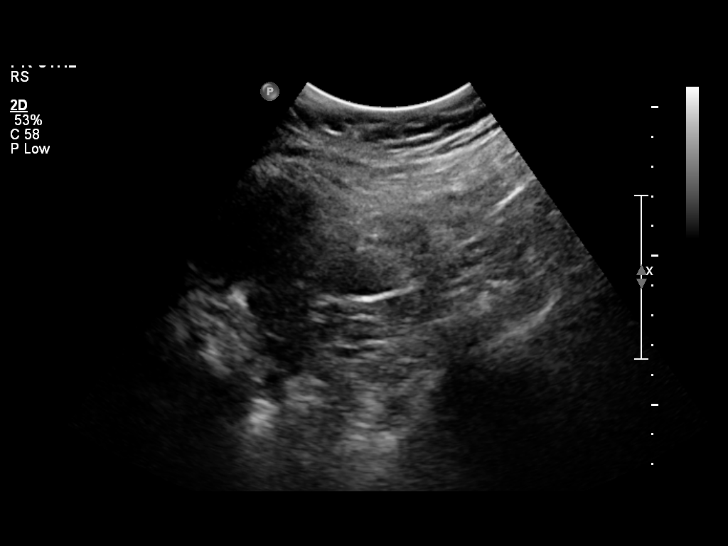
[im 27/28]
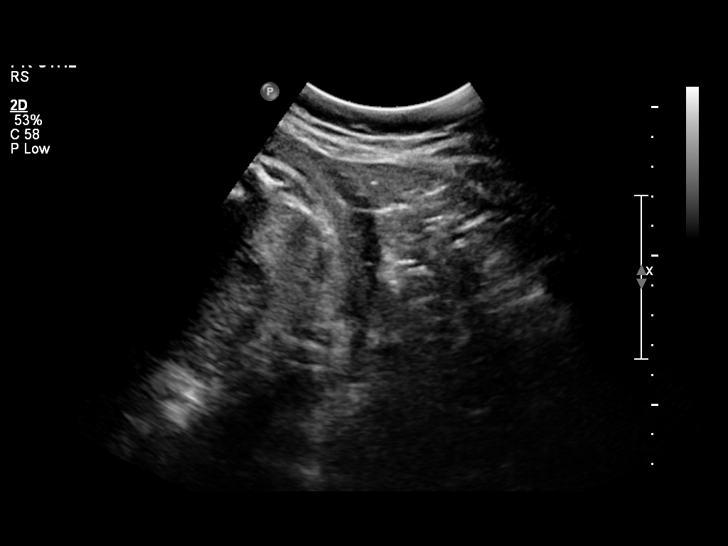

[13 of 28 positions shown; findings below may reference images not displayed]

OBSTETRICS REPORT
                    (Corrected Final 09/11/2011 [DATE])

 Order#:         [id number]
                 [id number]
Procedures

 [HOSPITAL]                                         76815.0
 US UA CORD DOPPLER                                    76827.2
Indications

 Hypertension
 Assess fetal well being
Fetal Evaluation

 Preg. Location:    Intrauterine
 Fetal Heart Rate:  134                          bpm
 Cardiac Activity:  Observed
 Presentation:      Cephalic
 Placenta:          Fundal, above cervical os

 Amniotic Fluid
 AFI FV:      Subjectively decreased
 AFI Sum:     6.14    cm      < 3  %Tile     Larg Pckt:     3.5  cm
 RUQ:   2.64    cm   LLQ:    3.5    cm
Biophysical Evaluation

 Amniotic F.V:   Pocket => 2 cm two         F. Tone:        Observed
                 planes
 F. Movement:    Not Observed               Score:          [DATE]
 F. Breathing:   Observed
Gestational Age

 Clinical EDD:  35w 2d                                        EDD:   10/13/11
 Best:          35w 2d     Det. By:  Clinical EDD             EDD:   10/13/11
Doppler - Fetal Vessels

 Umbilical Artery
 S/D:   2.7            62  %tile
 Umbilical Artery
 Absent DFV:    No     Reverse DFV:    No
Cervix Uterus Adnexa

 Cervix:       Not visualized (advanced GA >34 wks)
 Uterus:       No abnormality visualized.
 Cul De Sac:   No free fluid seen.

 Adnexa:     No abnormality visualized.
Impression

 Single living IUP with assigned GA of 35w 2d in cephalic
 position.
 Oligohydramnios. AFI is 6 cm (<3rd percentile for GA).
 BPP is [DATE]. A score of "0" was given for lack of
 sustained fetal breathing.
 Umbilical artery doppler within normal limits.

 This report was discussed with Dr. Blain by Zion Crider
 immediately after the study was performed on 09/10/2011. The
 patient will be seen in the office for NST directly after leaving
 our department.

                 Attending Physician, DAYMI

## 2012-12-28 LAB — OB RESULTS CONSOLE GBS: GBS: NEGATIVE

## 2013-01-17 ENCOUNTER — Encounter (HOSPITAL_COMMUNITY)
Admission: RE | Admit: 2013-01-17 | Discharge: 2013-01-17 | Disposition: A | Discharge status: Home / Self Care | Payer: BC Managed Care – PPO | Source: Ambulatory Visit | Attending: Obstetrics and Gynecology | Admitting: Obstetrics and Gynecology

## 2013-01-17 ENCOUNTER — Encounter (HOSPITAL_COMMUNITY): Payer: Self-pay

## 2013-01-17 LAB — CBC
MCH: 25.8 pg — ABNORMAL LOW (ref 26.0–34.0)
MCHC: 33.4 g/dL (ref 30.0–36.0)
MCV: 77.3 fL — ABNORMAL LOW (ref 78.0–100.0)
Platelets: 199 10*3/uL (ref 150–400)
RDW: 14 % (ref 11.5–15.5)

## 2013-01-17 LAB — RPR: RPR Ser Ql: NONREACTIVE

## 2013-01-17 NOTE — Patient Instructions (Signed)
Your procedure is scheduled on:01/18/13  Enter through the Main Entrance at :1030 am Pick up desk phone and dial [phone] and inform us of your arrival.  Please call [phone] if you have any problems the morning of surgery.  Remember: Do not eat food after midnight:tonight Water ok until:0800am Tuesday   You may brush your teeth the morning of surgery.  Take these meds the morning of surgery with a sip of water: Procardia  DO NOT wear jewelry, eye make-up, lipstick,body lotion, or dark fingernail polish.  (Polished toes are ok) You may wear deodorant.  If you are to be admitted after surgery, leave suitcase in car until your room has been assigned. Patients discharged on the day of surgery will not be allowed to drive home. Wear loose fitting, comfortable clothes for your ride home.

## 2013-01-18 ENCOUNTER — Encounter (HOSPITAL_COMMUNITY): Payer: Self-pay | Admitting: Registered Nurse

## 2013-01-18 ENCOUNTER — Encounter (HOSPITAL_COMMUNITY): Payer: BC Managed Care – PPO | Admitting: Registered Nurse

## 2013-01-18 ENCOUNTER — Inpatient Hospital Stay (HOSPITAL_COMMUNITY)
Admission: AD | Admit: 2013-01-18 | Discharge: 2013-01-20 | DRG: 765 | Disposition: A | Discharge status: Home / Self Care | Payer: BC Managed Care – PPO | Source: Ambulatory Visit | Attending: Obstetrics and Gynecology | Admitting: Obstetrics and Gynecology

## 2013-01-18 ENCOUNTER — Inpatient Hospital Stay (HOSPITAL_COMMUNITY): Payer: BC Managed Care – PPO | Admitting: Registered Nurse

## 2013-01-18 ENCOUNTER — Encounter (HOSPITAL_COMMUNITY)
Admission: AD | Disposition: A | Discharge status: Home / Self Care | Payer: Self-pay | Source: Ambulatory Visit | Attending: Obstetrics and Gynecology

## 2013-01-18 DIAGNOSIS — O34219 Maternal care for unspecified type scar from previous cesarean delivery: Principal | ICD-10-CM | POA: Diagnosis present

## 2013-01-18 DIAGNOSIS — D649 Anemia, unspecified: Secondary | ICD-10-CM | POA: Diagnosis present

## 2013-01-18 DIAGNOSIS — O9902 Anemia complicating childbirth: Secondary | ICD-10-CM | POA: Diagnosis present

## 2013-01-18 DIAGNOSIS — O1002 Pre-existing essential hypertension complicating childbirth: Secondary | ICD-10-CM | POA: Diagnosis present

## 2013-01-18 SURGERY — Surgical Case
Anesthesia: Spinal | Site: Abdomen | Wound class: Clean Contaminated

## 2013-01-18 MED ORDER — MENTHOL 3 MG MT LOZG: 1.0000 | LOZENGE | OROMUCOSAL | Status: DC | PRN

## 2013-01-18 MED ORDER — MORPHINE SULFATE (PF) 0.5 MG/ML IJ SOLN
INTRAMUSCULAR | Status: DC | PRN
  Administered 2013-01-18: .15 mg via INTRATHECAL

## 2013-01-18 MED ORDER — EPHEDRINE SULFATE 50 MG/ML IJ SOLN
INTRAMUSCULAR | Status: DC | PRN
  Administered 2013-01-18 (×2): 5 mg via INTRAVENOUS

## 2013-01-18 MED ORDER — SODIUM CHLORIDE 0.9 % IJ SOLN: 3.0000 mL | INTRAMUSCULAR | Status: DC | PRN

## 2013-01-18 MED ORDER — ONDANSETRON HCL 4 MG/2ML IJ SOLN: 4.0000 mg | INTRAMUSCULAR | Status: DC | PRN

## 2013-01-18 MED ORDER — OXYTOCIN 10 UNIT/ML IJ SOLN
40.0000 [IU] | INTRAVENOUS | Status: DC | PRN
  Administered 2013-01-18: 40 [IU] via INTRAVENOUS

## 2013-01-18 MED ORDER — ONDANSETRON HCL 4 MG/2ML IJ SOLN
INTRAMUSCULAR | Status: AC
  Filled 2013-01-18: qty 2

## 2013-01-18 MED ORDER — LACTATED RINGERS IV SOLN
Freq: Once | INTRAVENOUS | Status: AC
  Administered 2013-01-18: 11:00:00 via INTRAVENOUS

## 2013-01-18 MED ORDER — TETANUS-DIPHTH-ACELL PERTUSSIS 5-2.5-18.5 LF-MCG/0.5 IM SUSP
0.5000 mL | Freq: Once | INTRAMUSCULAR | Status: AC
  Administered 2013-01-19: 0.5 mL via INTRAMUSCULAR
  Filled 2013-01-18: qty 0.5

## 2013-01-18 MED ORDER — ONDANSETRON HCL 4 MG/2ML IJ SOLN
INTRAMUSCULAR | Status: DC | PRN
  Administered 2013-01-18: 4 mg via INTRAVENOUS

## 2013-01-18 MED ORDER — KETOROLAC TROMETHAMINE 60 MG/2ML IM SOLN
INTRAMUSCULAR | Status: AC
  Administered 2013-01-18: 60 mg via INTRAMUSCULAR
  Filled 2013-01-18: qty 2

## 2013-01-18 MED ORDER — INFLUENZA VAC SPLIT QUAD 0.5 ML IM SUSP
0.5000 mL | INTRAMUSCULAR | Status: AC
  Administered 2013-01-19: 0.5 mL via INTRAMUSCULAR
  Filled 2013-01-18: qty 0.5

## 2013-01-18 MED ORDER — METOCLOPRAMIDE HCL 5 MG/ML IJ SOLN: 10.0000 mg | Freq: Three times a day (TID) | INTRAMUSCULAR | Status: DC | PRN

## 2013-01-18 MED ORDER — MORPHINE SULFATE 0.5 MG/ML IJ SOLN
INTRAMUSCULAR | Status: AC
  Filled 2013-01-18: qty 10

## 2013-01-18 MED ORDER — KETOROLAC TROMETHAMINE 30 MG/ML IJ SOLN: 30.0000 mg | Freq: Four times a day (QID) | INTRAMUSCULAR | Status: AC | PRN

## 2013-01-18 MED ORDER — GLYCOPYRROLATE 0.2 MG/ML IJ SOLN
INTRAMUSCULAR | Status: AC
  Filled 2013-01-18: qty 1

## 2013-01-18 MED ORDER — GLYCOPYRROLATE 0.2 MG/ML IJ SOLN
INTRAMUSCULAR | Status: DC | PRN
  Administered 2013-01-18: 0.2 mg via INTRAVENOUS

## 2013-01-18 MED ORDER — 0.9 % SODIUM CHLORIDE (POUR BTL) OPTIME
TOPICAL | Status: DC | PRN
  Administered 2013-01-18: 1000 mL

## 2013-01-18 MED ORDER — FENTANYL CITRATE 0.05 MG/ML IJ SOLN: 25.0000 ug | INTRAMUSCULAR | Status: DC | PRN

## 2013-01-18 MED ORDER — OXYCODONE-ACETAMINOPHEN 5-325 MG PO TABS
1.0000 | ORAL_TABLET | ORAL | Status: DC | PRN
  Administered 2013-01-20 (×2): 1 via ORAL
  Filled 2013-01-18 (×2): qty 1

## 2013-01-18 MED ORDER — SCOPOLAMINE 1 MG/3DAYS TD PT72: 1.0000 | MEDICATED_PATCH | Freq: Once | TRANSDERMAL | Status: DC

## 2013-01-18 MED ORDER — SIMETHICONE 80 MG PO CHEW: 80.0000 mg | CHEWABLE_TABLET | ORAL | Status: DC | PRN

## 2013-01-18 MED ORDER — KETOROLAC TROMETHAMINE 60 MG/2ML IM SOLN
60.0000 mg | Freq: Once | INTRAMUSCULAR | Status: AC | PRN
  Administered 2013-01-18: 60 mg via INTRAMUSCULAR

## 2013-01-18 MED ORDER — SCOPOLAMINE 1 MG/3DAYS TD PT72
1.0000 | MEDICATED_PATCH | Freq: Once | TRANSDERMAL | Status: DC
  Administered 2013-01-18: 1.5 mg via TRANSDERMAL

## 2013-01-18 MED ORDER — ONDANSETRON HCL 4 MG PO TABS: 4.0000 mg | ORAL_TABLET | ORAL | Status: DC | PRN

## 2013-01-18 MED ORDER — PHENYLEPHRINE 8 MG IN D5W 100 ML (0.08MG/ML) PREMIX OPTIME
INJECTION | INTRAVENOUS | Status: DC | PRN
  Administered 2013-01-18: 60 ug/min via INTRAVENOUS

## 2013-01-18 MED ORDER — DIPHENHYDRAMINE HCL 50 MG/ML IJ SOLN: 25.0000 mg | INTRAMUSCULAR | Status: DC | PRN

## 2013-01-18 MED ORDER — FENTANYL CITRATE 0.05 MG/ML IJ SOLN
INTRAMUSCULAR | Status: DC | PRN
  Administered 2013-01-18: 25 ug via INTRATHECAL

## 2013-01-18 MED ORDER — PHENYLEPHRINE HCL 10 MG/ML IJ SOLN
INTRAMUSCULAR | Status: AC
  Filled 2013-01-18: qty 1

## 2013-01-18 MED ORDER — METOCLOPRAMIDE HCL 5 MG/ML IJ SOLN: 10.0000 mg | Freq: Once | INTRAMUSCULAR | Status: DC | PRN

## 2013-01-18 MED ORDER — SENNOSIDES-DOCUSATE SODIUM 8.6-50 MG PO TABS
2.0000 | ORAL_TABLET | ORAL | Status: DC
  Administered 2013-01-19 (×2): 2 via ORAL
  Filled 2013-01-18 (×2): qty 2

## 2013-01-18 MED ORDER — SIMETHICONE 80 MG PO CHEW
80.0000 mg | CHEWABLE_TABLET | ORAL | Status: DC
  Administered 2013-01-19: 80 mg via ORAL
  Filled 2013-01-18 (×2): qty 1

## 2013-01-18 MED ORDER — SCOPOLAMINE 1 MG/3DAYS TD PT72
MEDICATED_PATCH | TRANSDERMAL | Status: AC
  Administered 2013-01-18: 1.5 mg via TRANSDERMAL
  Filled 2013-01-18: qty 1

## 2013-01-18 MED ORDER — IBUPROFEN 600 MG PO TABS
600.0000 mg | ORAL_TABLET | Freq: Four times a day (QID) | ORAL | Status: DC
  Administered 2013-01-19 – 2013-01-20 (×6): 600 mg via ORAL
  Filled 2013-01-18 (×6): qty 1

## 2013-01-18 MED ORDER — NALOXONE HCL 0.4 MG/ML IJ SOLN: 0.4000 mg | INTRAMUSCULAR | Status: DC | PRN

## 2013-01-18 MED ORDER — MEPERIDINE HCL 25 MG/ML IJ SOLN: 6.2500 mg | INTRAMUSCULAR | Status: DC | PRN

## 2013-01-18 MED ORDER — OXYTOCIN 10 UNIT/ML IJ SOLN
INTRAMUSCULAR | Status: AC
  Filled 2013-01-18: qty 3

## 2013-01-18 MED ORDER — DIPHENHYDRAMINE HCL 25 MG PO CAPS: 25.0000 mg | ORAL_CAPSULE | Freq: Four times a day (QID) | ORAL | Status: DC | PRN

## 2013-01-18 MED ORDER — LANOLIN HYDROUS EX OINT: 1.0000 "application " | TOPICAL_OINTMENT | CUTANEOUS | Status: DC | PRN

## 2013-01-18 MED ORDER — LACTATED RINGERS IV SOLN: INTRAVENOUS | Status: DC

## 2013-01-18 MED ORDER — DIPHENHYDRAMINE HCL 50 MG/ML IJ SOLN: 12.5000 mg | INTRAMUSCULAR | Status: DC | PRN

## 2013-01-18 MED ORDER — DIPHENHYDRAMINE HCL 25 MG PO CAPS: 25.0000 mg | ORAL_CAPSULE | ORAL | Status: DC | PRN

## 2013-01-18 MED ORDER — WITCH HAZEL-GLYCERIN EX PADS: 1.0000 "application " | MEDICATED_PAD | CUTANEOUS | Status: DC | PRN

## 2013-01-18 MED ORDER — FENTANYL CITRATE 0.05 MG/ML IJ SOLN
INTRAMUSCULAR | Status: AC
  Filled 2013-01-18: qty 2

## 2013-01-18 MED ORDER — KETOROLAC TROMETHAMINE 30 MG/ML IJ SOLN
30.0000 mg | Freq: Four times a day (QID) | INTRAMUSCULAR | Status: AC | PRN
  Administered 2013-01-18: 30 mg via INTRAVENOUS
  Filled 2013-01-18: qty 1

## 2013-01-18 MED ORDER — NIFEDIPINE ER 60 MG PO TB24
60.0000 mg | ORAL_TABLET | Freq: Every day | ORAL | Status: DC
Start: 2013-01-19 — End: 2013-01-20
  Administered 2013-01-20: 60 mg via ORAL
  Filled 2013-01-18 (×2): qty 1

## 2013-01-18 MED ORDER — LACTATED RINGERS IV SOLN
INTRAVENOUS | Status: DC
  Administered 2013-01-18 – 2013-01-19 (×2): via INTRAVENOUS

## 2013-01-18 MED ORDER — OXYTOCIN 10 UNIT/ML IJ SOLN
INTRAMUSCULAR | Status: AC
  Filled 2013-01-18: qty 1

## 2013-01-18 MED ORDER — OXYTOCIN 40 UNITS IN LACTATED RINGERS INFUSION - SIMPLE MED: 62.5000 mL/h | INTRAVENOUS | Status: AC

## 2013-01-18 MED ORDER — BUPIVACAINE IN DEXTROSE 0.75-8.25 % IT SOLN
INTRATHECAL | Status: DC | PRN
  Administered 2013-01-18: 12 mg via INTRATHECAL

## 2013-01-18 MED ORDER — NALOXONE HCL 1 MG/ML IJ SOLN: 1.0000 ug/kg/h | INTRAVENOUS | Status: DC | PRN

## 2013-01-18 MED ORDER — ZOLPIDEM TARTRATE 5 MG PO TABS: 5.0000 mg | ORAL_TABLET | Freq: Every evening | ORAL | Status: DC | PRN

## 2013-01-18 MED ORDER — DIBUCAINE 1 % RE OINT: 1.0000 "application " | TOPICAL_OINTMENT | RECTAL | Status: DC | PRN

## 2013-01-18 MED ORDER — PRENATAL MULTIVITAMIN CH
1.0000 | ORAL_TABLET | Freq: Every day | ORAL | Status: DC
  Administered 2013-01-19 – 2013-01-20 (×2): 1 via ORAL
  Filled 2013-01-18 (×2): qty 1

## 2013-01-18 MED ORDER — CEFAZOLIN SODIUM-DEXTROSE 2-3 GM-% IV SOLR
INTRAVENOUS | Status: AC
  Administered 2013-01-18: 2 g via INTRAVENOUS
  Filled 2013-01-18: qty 50

## 2013-01-18 MED ORDER — SIMETHICONE 80 MG PO CHEW
80.0000 mg | CHEWABLE_TABLET | Freq: Three times a day (TID) | ORAL | Status: DC
  Administered 2013-01-19 – 2013-01-20 (×5): 80 mg via ORAL
  Filled 2013-01-18 (×4): qty 1

## 2013-01-18 MED ORDER — LACTATED RINGERS IV SOLN
INTRAVENOUS | Status: DC
  Administered 2013-01-18 (×3): via INTRAVENOUS

## 2013-01-18 MED ORDER — NALBUPHINE HCL 10 MG/ML IJ SOLN
5.0000 mg | INTRAMUSCULAR | Status: DC | PRN
  Administered 2013-01-18: 10 mg via INTRAVENOUS

## 2013-01-18 MED ORDER — NALBUPHINE HCL 10 MG/ML IJ SOLN
5.0000 mg | INTRAMUSCULAR | Status: DC | PRN
  Filled 2013-01-18: qty 1

## 2013-01-18 MED ORDER — KETOROLAC TROMETHAMINE 30 MG/ML IJ SOLN: 15.0000 mg | Freq: Once | INTRAMUSCULAR | Status: DC | PRN

## 2013-01-18 MED ORDER — ONDANSETRON HCL 4 MG/2ML IJ SOLN: 4.0000 mg | Freq: Three times a day (TID) | INTRAMUSCULAR | Status: DC | PRN

## 2013-01-18 MED ORDER — EPHEDRINE 5 MG/ML INJ
INTRAVENOUS | Status: AC
  Filled 2013-01-18: qty 10

## 2013-01-18 SURGICAL SUPPLY — 30 items
ADH SKN CLS APL DERMABOND .7 (GAUZE/BANDAGES/DRESSINGS) ×1
CLAMP CORD UMBIL (MISCELLANEOUS) IMPLANT
CLOTH BEACON ORANGE TIMEOUT ST (SAFETY) ×2 IMPLANT
DERMABOND ADVANCED (GAUZE/BANDAGES/DRESSINGS) ×1
DERMABOND ADVANCED .7 DNX12 (GAUZE/BANDAGES/DRESSINGS) IMPLANT
DRAPE LG THREE QUARTER DISP (DRAPES) ×4 IMPLANT
DRSG OPSITE POSTOP 4X10 (GAUZE/BANDAGES/DRESSINGS) ×2 IMPLANT
DURAPREP 26ML APPLICATOR (WOUND CARE) ×2 IMPLANT
ELECT REM PT RETURN 9FT ADLT (ELECTROSURGICAL) ×2
ELECTRODE REM PT RTRN 9FT ADLT (ELECTROSURGICAL) ×1 IMPLANT
EXTRACTOR VACUUM M CUP 4 TUBE (SUCTIONS) IMPLANT
GLOVE BIO SURGEON STRL SZ7 (GLOVE) ×2 IMPLANT
GOWN STRL REIN XL XLG (GOWN DISPOSABLE) ×4 IMPLANT
KIT ABG SYR 3ML LUER SLIP (SYRINGE) IMPLANT
NDL HYPO 25X5/8 SAFETYGLIDE (NEEDLE) IMPLANT
NEEDLE HYPO 25X5/8 SAFETYGLIDE (NEEDLE) IMPLANT
NS IRRIG 1000ML POUR BTL (IV SOLUTION) ×2 IMPLANT
PACK C SECTION WH (CUSTOM PROCEDURE TRAY) ×2 IMPLANT
PAD OB MATERNITY 4.3X12.25 (PERSONAL CARE ITEMS) ×2 IMPLANT
RTRCTR C-SECT PINK 25CM LRG (MISCELLANEOUS) ×2 IMPLANT
STAPLER VISISTAT 35W (STAPLE) IMPLANT
SUT CHROMIC 1 CTX 36 (SUTURE) ×5 IMPLANT
SUT PDS AB 0 CTX 60 (SUTURE) ×2 IMPLANT
SUT PLAIN 2 0 XLH (SUTURE) ×1 IMPLANT
SUT VIC AB 2-0 CT1 27 (SUTURE) ×2
SUT VIC AB 2-0 CT1 TAPERPNT 27 (SUTURE) ×1 IMPLANT
SUT VIC AB 4-0 KS 27 (SUTURE) ×1 IMPLANT
TOWEL OR 17X24 6PK STRL BLUE (TOWEL DISPOSABLE) ×2 IMPLANT
TRAY FOLEY CATH 14FR (SET/KITS/TRAYS/PACK) ×2 IMPLANT
WATER STERILE IRR 1000ML POUR (IV SOLUTION) ×1 IMPLANT

## 2013-01-18 NOTE — Preoperative (Signed)
Beta Blockers   Reason not to administer Beta Blockers:Not Applicable 

## 2013-01-18 NOTE — Lactation Note (Signed)
This note was copied from the chart of Nancy Rice. Lactation Consultation Note Mom previously given #20 nipple shield and hand pump by nursery RN. Mom had flat nipples and struggled with nipple shield with previous 71 month old and only breast fed a short time due to low supply.  Initial [facility] LC resources given and discussed.  Mom has large pendulous breast, hand expression demonstrated for several attempt and no visible colostrum. Hand pump and reverse pressure used on left nipple with little improvement, baby awakened with feeding cues.  Not able to hold breast in his mouth.  #20 nipple shield used, baby sucked a few times.  Nipple compressed with a positional stripe. Taught mom how to correctly place nipple shield.  Baby re latched for a few sucks, improved latch with nipple pulled appropriately into nipple shield. No colostrum noted. Encouraged breast massage, hand expression, skin to skin and frequent breast attempts.  Mom maybe a canidate for SNS, baby is now 60 hours old.  Patient Name: Nancy Rafael Quesada [id number] Date: 01/18/2013 Reason for consult: Initial assessment;Difficult latch   Maternal Data Has patient been taught Hand Expression?: Yes Does the patient have breastfeeding experience prior to this delivery?: Yes  Feeding Feeding Type: Breast Fed Length of feed:  (few sucks)  LATCH Score/Interventions Latch: Repeated attempts needed to sustain latch, nipple held in mouth throughout feeding, stimulation needed to elicit sucking reflex. Intervention(s): Adjust position;Assist with latch;Breast massage;Breast compression  Audible Swallowing: None Intervention(s): Skin to skin;Hand expression;Alternate breast massage  Type of Nipple: Flat Intervention(s): Reverse pressure;Hand pump Intervention(s):  (nipple shiled)  Comfort (Breast/Nipple): Soft / non-tender     Hold (Positioning): Assistance needed to correctly position infant at breast and maintain latch.  LATCH  Score: 5  Lactation Tools Discussed/Used Tools: Nipple Shields Nipple shield size: 20   Consult Status Consult Status: Follow-up Date: 01/19/13 Follow-up type: In-patient    Beverely Risen Arvella Merles 01/18/2013, 10:55 PM

## 2013-01-18 NOTE — Anesthesia Postprocedure Evaluation (Signed)
  Anesthesia Post Note  Patient: Nancy Rice  Procedure(s) Performed: Procedure(s) (LRB): REPEAT CESAREAN SECTION (N/A)  Anesthesia type: Spinal  Patient location: PACU  Post pain: Pain level controlled  Post assessment: Post-op Vital signs reviewed  Last Vitals:  Filed Vitals:   01/18/13 1347  BP: 97/70  Pulse: 81  Temp: 36.6 C  Resp: 16    Post vital signs: Reviewed  Level of consciousness: awake  Complications: No apparent anesthesia complications

## 2013-01-18 NOTE — Op Note (Signed)
Pre-Operative Diagnosis: 1) 39+0 week intrauterine pregnancy 2) History of prior cesarean section, desires repeat Postoperative Diagnosis: Same and thin window in lower uterine segment Procedure: Repeat low transverse cesarean section Surgeon: Dr. Waynard Reeds Assistant: None Operative Findings: Vigorous female infant in vertex presentation with apgars of 9 & 9. Normal ovaries and tubes. There was a very thin lower uterine segment with the fetal head visible through the uterus Specimen: Placenta with eccentric cord insertion EBL: Total I/O In: 2700 [I.V.:2700] Out: 1150 [Urine:350; Blood:800]   Procedure:Ms. Pluta is an 32 year old gravida 2 para 1001 at 90 weeks and 0 days estimated gestational age who presents for cesarean section. Following the appropriate informed consent the patient was brought to the operating room where spinal anesthesia was administered and found to be adequate. She was placed in the dorsal supine position with a leftward tilt. She was prepped and draped in the normal sterile fashion. Scalpel was then used to make a Pfannenstiel skin incision which was carried down to the underlying layers of soft tissue to the fascia. The fascia was incised in the midline and the fascial incision was extended laterally with Mayo scissors. The superior aspect of the fascial incision was grasped with Coker clamps x2, tented up and the rectus muscles dissected off sharply with the electrocautery unit area and the same procedure was repeated on the inferior aspect of the fascial incision. The rectus muscles were separated in the midline. The abdominal peritoneum was identified, tented up, entered sharply, and the incision was extended superiorly and inferiorly with good visualization of the bladder. The Alexis retractor was then deployed. The vesicouterine peritoneum was identified, tented up, entered sharply, and the bladder flap was created digitally. Scalpel was then used to make a low transverse  incision on the uterus which was extended laterally with blunt dissection. The fetal vertex was identified, delivered easily through the uterine incision followed by the body. The infant was bulb suctioned on the operative field cried vigorously, cord was clamped and cut and the infant was passed to the waiting neonatologist. Placenta was then delivered spontaneously, the uterus was cleared of all clot and debris. The uterine incision was repaired with #1 chromic in running locked fashion followed by a second imbricating layer. Ovaries and tubes were inspected and normal. The Alexis retractor was removed. The uterus was returned to the abdominal cavity the abdominal cavity was cleared of all clot and debris. The abdominal peritoneum was reapproximated with 2-0 Vicryl in a running fashion, the rectus muscles was reapproximated with #1 chromic in a running fashion. The fascia was closed with a looped PDS in a running fashion. The skin was closed with 4-0 vicryl in a subcuticular fashion and Dermabond. All sponge lap and needle counts were correct x2. Patient tolerated the procedure well and recovered in stable condition following the procedure.

## 2013-01-18 NOTE — Anesthesia Preprocedure Evaluation (Addendum)
Anesthesia Evaluation  Patient identified by MRN, date of birth, ID band Patient awake    Reviewed: Allergy & Precautions, H&P , NPO status , Patient's Chart, lab work & pertinent test results, reviewed documented beta blocker date and time   History of Anesthesia Complications Negative for: history of anesthetic complications  Airway Mallampati: III TM Distance: >3 FB Neck ROM: full    Dental no notable dental hx. (+) Teeth Intact   Pulmonary neg pulmonary ROS,  breath sounds clear to auscultation  Pulmonary exam normal       Cardiovascular negative cardio ROS  Rhythm:regular Rate:Normal     Neuro/Psych negative neurological ROS  negative psych ROS   GI/Hepatic Neg liver ROS, GERD- (tums prn with pregnancy)  Medicated,  Endo/Other  negative endocrine ROS  Renal/GU negative Renal ROS  negative genitourinary   Musculoskeletal   Abdominal   Peds  Hematology negative hematology ROS (+)   Anesthesia Other Findings   Reproductive/Obstetrics (+) Pregnancy (h/o c/s x1, for repeat)                          Anesthesia Physical  Anesthesia Plan  ASA: II  Anesthesia Plan: Spinal   Post-op Pain Management:    Induction:   Airway Management Planned:   Additional Equipment:   Intra-op Plan:   Post-operative Plan:   Informed Consent: I have reviewed the patients History and Physical, chart, labs and discussed the procedure including the risks, benefits and alternatives for the proposed anesthesia with the patient or authorized representative who has indicated his/her understanding and acceptance.     Plan Discussed with: Surgeon and CRNA  Anesthesia Plan Comments:        Anesthesia Quick Evaluation

## 2013-01-18 NOTE — H&P (Signed)
Nancy Rice is a 32 y.o. female presenting for repeat cesarean section  32 G2P1001 @ 39+0 presents for repeat cesarean section.  She has been followed for cHTN during this pregnancy. History OB History   Grav Para Term Preterm Abortions TAB SAB Ect Mult Living   2 1 1  0 0 0 0 0 0 1     Past Medical History  Diagnosis Date  . Hypertension    Past Surgical History  Procedure Laterality Date  . Cesarean section  10/07/2011    Procedure: CESAREAN SECTION;  Surgeon: Nancy March. Tenny Craw, MD;  Location: [facility];  Service: Gynecology;  Laterality: N/A;   Family History: family history includes Cancer in her father; Heart disease in her maternal grandmother and paternal grandfather; Hypertension in her father and mother. Social History:  reports that she has never smoked. She has never used smokeless tobacco. She reports that she does not drink alcohol or use illicit drugs.   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 rate 20, last menstrual period 04/20/2012, SpO2 100.00%. Exam Physical Exam  Prenatal labs: ABO, Rh: --/--/O POS (10/20 1105) Antibody: NEG (10/20 1105) Rubella: Immune (04/07 0000) RPR: NON REACTIVE (10/20 1105)  HBsAg: Negative (04/07 0000)  HIV: Non-reactive (04/07 0000)  GBS: Negative (09/30 0000)   Assessment/Plan: 1) Admit 2) Consent for cesarean 3) Ancef 2 gm   Nancy Rice H. 01/18/2013, 12:09 PM

## 2013-01-18 NOTE — Anesthesia Procedure Notes (Signed)
Spinal  Patient location during procedure: OR Start time: 01/18/2013 12:30 PM Staffing Anesthesiologist: Angus Seller., Harrell Gave. Performed by: anesthesiologist  Preanesthetic Checklist Completed: patient identified, site marked, surgical consent, pre-op evaluation, timeout performed, IV checked, risks and benefits discussed and monitors and equipment checked Spinal Block Patient position: sitting Prep: DuraPrep Patient monitoring: heart rate, cardiac monitor, continuous pulse ox and blood pressure Approach: midline Location: L3-4 Injection technique: single-shot Needle Needle type: Sprotte  Needle gauge: 24 G Needle length: 9 cm Assessment Sensory level: T4 Additional Notes Patient identified.  Risk benefits discussed including failed block, incomplete pain control, headache, nerve damage, paralysis, blood pressure changes, nausea, vomiting, reactions to medication both toxic or allergic, and postpartum back pain.  Patient expressed understanding and wished to proceed.  All questions were answered.  Sterile technique used throughout procedure.  CSF was clear.  No parasthesia or other complications.  Please see nursing notes for vital signs.

## 2013-01-18 NOTE — Transfer of Care (Signed)
Immediate Anesthesia Transfer of Care Note  Patient: Nancy Rice  Procedure(s) Performed: Procedure(s): REPEAT CESAREAN SECTION (N/A)  Patient Location: PACU  Anesthesia Type:Spinal  Level of Consciousness: awake, alert  and oriented  Airway & Oxygen Therapy: Patient Spontanous Breathing  Post-op Assessment: Report given to PACU RN  Post vital signs: Reviewed  Complications: No apparent anesthesia complications

## 2013-01-19 ENCOUNTER — Encounter (HOSPITAL_COMMUNITY): Payer: Self-pay | Admitting: Obstetrics and Gynecology

## 2013-01-19 LAB — CBC
Hemoglobin: 8.6 g/dL — ABNORMAL LOW (ref 12.0–15.0)
MCHC: 33.7 g/dL (ref 30.0–36.0)
Platelets: 158 10*3/uL (ref 150–400)
RBC: 3.25 MIL/uL — ABNORMAL LOW (ref 3.87–5.11)
RDW: 14.5 % (ref 11.5–15.5)
WBC: 7.8 10*3/uL (ref 4.0–10.5)

## 2013-01-19 LAB — BIRTH TISSUE RECOVERY COLLECTION (PLACENTA DONATION)

## 2013-01-19 MED ORDER — FERROUS SULFATE 325 (65 FE) MG PO TABS
325.0000 mg | ORAL_TABLET | Freq: Two times a day (BID) | ORAL | Status: DC
  Administered 2013-01-19 – 2013-01-20 (×2): 325 mg via ORAL
  Filled 2013-01-19 (×2): qty 1

## 2013-01-19 NOTE — Lactation Note (Signed)
This note was copied from the chart of Nancy Rice. Lactation Consultation Note  Patient Name: Nancy Rice Date: 01/19/2013 Reason for consult: Follow-up assessment Mom is concerned that baby is not getting enough at the breast as he is very fussy, always sucking on his hands. Mom has history of low milk supply with 1st baby and the 1st baby had significant weight loss. Parents are worried this will happen again. Baby is latching using #20 nipple shield. Discussed options with parents and it was decided that we would supplement the baby, set up DEBP for Mom to pump after feedings to encourage milk production. Parents choice was to use Similac formula until Mom gets EBM. Reviewed guidelines for supplementing with breastfeeding with parents. At this feeding demonstrated how to pre-load the nipple shield using curved tipped syringe. Also demonstrated how to finger feed using curved tipped syringe. Baby latched easily using nipple shield, assisted Mom with positioning, supporting baby and breast. Baby demonstrated a good rhythmic suck, BF for 10 minutes, taking 6 ml of formula between finger feeding and in the nipple shield. Colostrum present in the nipple shield when baby came off the breast. Started Mom pumping using DEBP. Advised to pump after feedings every 3 hours for 15 minutes on preemie setting during the day/evening. Parents to call with the next feeding for LC to demonstrate how to use SNS.   Maternal Data    Feeding Feeding Type: Formula Length of feed: 10 min  LATCH Score/Interventions Latch: Grasps breast easily, tongue down, lips flanged, rhythmical sucking. (using #20 nipple shield) Intervention(s): Adjust position;Assist with latch  Audible Swallowing: Spontaneous and intermittent (pre-loading NS with formula)  Type of Nipple: Flat  Comfort (Breast/Nipple): Soft / non-tender     Hold (Positioning): Assistance needed to correctly position infant at breast  and maintain latch. Intervention(s): Breastfeeding basics reviewed;Support Pillows;Position options;Skin to skin  LATCH Score: 8  Lactation Tools Discussed/Used Tools: Pump;Nipple Shields Nipple shield size: 20 Breast pump type: Double-Electric Breast Pump Pump Review: Setup, frequency, and cleaning Initiated by:: KG Date initiated:: 01/19/13   Consult Status Consult Status: Follow-up Date: 01/19/13 Follow-up type: In-patient    Alfred Levins 01/19/2013, 12:38 PM

## 2013-01-19 NOTE — Anesthesia Postprocedure Evaluation (Signed)
  Anesthesia Post-op Note  Patient: Nancy Rice  Procedure(s) Performed: Procedure(s): REPEAT CESAREAN SECTION (N/A)  Patient Location: Mother/Baby  Anesthesia Type:Spinal  Level of Consciousness: awake, alert , oriented and patient cooperative  Airway and Oxygen Therapy: Patient Spontanous Breathing  Post-op Pain: mild  Post-op Assessment: Patient's Cardiovascular Status Stable, Respiratory Function Stable, No headache, No backache, No residual numbness and No residual motor weakness  Post-op Vital Signs: stable  Complications: No apparent anesthesia complications

## 2013-01-19 NOTE — Progress Notes (Signed)
POD#1 Patient is eating, ambulating, voiding.  Pain control is good.  Appropriate lochia.  No complaints.  Filed Vitals:   01/18/13 2035 01/18/13 2233 01/19/13 0034 01/19/13 0432  BP: 114/71 124/82 115/63 104/64  Pulse: 82 68 57 65  Temp:  98 F (36.7 C) 98 F (36.7 C) 98.2 F (36.8 C)  TempSrc:  Oral Oral Oral  Resp: 18 18 18 18   Weight:      SpO2:  100% 98% 99%    Fundus firm Inc: c/d/i No CT  Lab Results  Component Value Date   WBC 7.8 01/19/2013   HGB 8.6* 01/19/2013   HCT 25.5* 01/19/2013   MCV 78.5 01/19/2013   PLT 158 01/19/2013    --/--/O POS (10/20 1105)  A/P Post op day 1 s/p R c/s. Anemia - FeSO4 Circ desired, consent obtained.  Routine care.    Philip Aspen

## 2013-01-20 MED ORDER — OXYCODONE-ACETAMINOPHEN 5-325 MG PO TABS: 1.0000 | ORAL_TABLET | ORAL | Status: DC | PRN

## 2013-01-20 NOTE — Discharge Summary (Signed)
Obstetric Discharge Summary Reason for Admission: cesarean section Prenatal Procedures: none Intrapartum Procedures: cesarean: low cervical, transverse Postpartum Procedures: none Complications-Operative and Postpartum: none Hemoglobin  Date Value Range Status  01/19/2013 8.6* 12.0 - 15.0 g/dL Final     DELTA CHECK NOTED     REPEATED TO VERIFY     HCT  Date Value Range Status  01/19/2013 25.5* 36.0 - 46.0 % Final    Discharge Diagnoses: Term Pregnancy-delivered  Discharge Information: Date: 01/20/2013 Activity: pelvic rest Diet: routine Medications: Iron and Percocet Condition: stable Instructions: refer to practice specific booklet Discharge to: home Follow-up Information   Follow up with Almon Hercules., MD In 4 weeks.   Specialty:  Obstetrics and Gynecology   Contact information:   [address] [place] [address] [phone]       Newborn Data: Live born female  Birth Weight: 6 lb 13.7 oz (3110 g) APGAR: 9, 9  Home with mother.  Gabrella Stroh A 01/20/2013, 7:41 AM

## 2013-01-20 NOTE — Progress Notes (Signed)
  Patient is eating, ambulating, voiding.  Pain control is good.  Filed Vitals:   01/19/13 0829 01/19/13 1230 01/19/13 1754 01/20/13 0610  BP: 111/61 116/76 117/70 123/79  Pulse: 54 60 81 56  Temp: 97.4 F (36.3 C) 97.8 F (36.6 C) 98.7 F (37.1 C) 98.3 F (36.8 C)  TempSrc: Axillary Oral Oral Oral  Resp: 18 20 18 16   Weight:      SpO2: 99% 99%      lungs:   clear to auscultation cor:    RRR Abdomen:  soft, appropriate tenderness, incisions intact and without erythema or exudate ex:    no cords   Lab Results  Component Value Date   WBC 7.8 01/19/2013   HGB 8.6* 01/19/2013   HCT 25.5* 01/19/2013   MCV 78.5 01/19/2013   PLT 158 01/19/2013    --/--/O POS (10/20 1105)/RI  A/P    Post operative day 2.  Routine post op and postpartum care.  Expect d/c today.  Percocet for pain control.

## 2013-01-20 NOTE — Lactation Note (Signed)
This note was copied from the chart of Nancy Havanna Groner. Lactation Consultation Note  Patient Name: Nancy Rice RUEAV'W Date: 01/20/2013 Reason for consult: Follow-up assessment;Difficult latch Mom requested LC demonstrate SNS before d/c today. Demonstrated set up and cleaning. Assisted Mom with using SNS with nipple shield at the breast. On exam, it is noted baby has a short, anterior frenulum with limited tongue movement. Mom advised of this and encouraged to speak with Peds about frenotomy since the baby has limited movement with tongue. He does not bring his tongue past the gum line and with suck exam he is not extending his tongue to breastfeed effectively. Plan written for Mom. Advised to breastfeed 8-12 times or more in 24 hours. Wake baby if needed. Set up SNS prior to feeding if using SNS, advised could use bottle with slow flow nipple as well. Pre pump for 5 minutes to get her flow of milk going. Latch baby using nipple shield/SNS. Advised to try and get baby to nurse on the 1st breast without SNS, keep active for 15 minutes, then switch to 2nd breast using SNS. Limit feeding time to 30-35 minutes, alternate each feeding which breast she uses SNS. Post pump during the day/evening for 15 minutes to encourage milk production. Supplements to support milk production discussed. Engorgement care reviewed if needed. Monitor void/stools. OP follow up scheduled for Tuesday, 01/25/13 at 2:30p. Advised of support group.   Maternal Data    Feeding Feeding Type: Formula Length of feed: 23 min  LATCH Score/Interventions Latch: Repeated attempts needed to sustain latch, nipple held in mouth throughout feeding, stimulation needed to elicit sucking reflex. (using #20 nipple shield) Intervention(s): Adjust position;Assist with latch  Audible Swallowing: Spontaneous and intermittent (with SNS/formula for supplement at breast)  Type of Nipple: Flat Intervention(s): Double electric  pump  Comfort (Breast/Nipple): Soft / non-tender     Hold (Positioning): Assistance needed to correctly position infant at breast and maintain latch. Intervention(s): Breastfeeding basics reviewed;Support Pillows;Position options;Skin to skin  LATCH Score: 7  Lactation Tools Discussed/Used Tools: Pump;Supplemental Nutrition System;Nipple Shields Nipple shield size: 20 Breast pump type: Double-Electric Breast Pump   Consult Status Consult Status: Complete Date: 01/20/13 Follow-up type: In-patient    Nancy Rice 01/20/2013, 10:16 AM

## 2013-01-25 ENCOUNTER — Ambulatory Visit (HOSPITAL_COMMUNITY): Payer: BC Managed Care – PPO

## 2014-01-30 ENCOUNTER — Encounter (HOSPITAL_COMMUNITY): Payer: Self-pay | Admitting: Obstetrics and Gynecology

## 2015-03-22 LAB — OB RESULTS CONSOLE ABO/RH: RH Type: POSITIVE

## 2015-03-22 LAB — OB RESULTS CONSOLE RPR: RPR: NONREACTIVE

## 2015-03-22 LAB — OB RESULTS CONSOLE ANTIBODY SCREEN: ANTIBODY SCREEN: NEGATIVE

## 2015-03-22 LAB — OB RESULTS CONSOLE HEPATITIS B SURFACE ANTIGEN: Hepatitis B Surface Ag: NEGATIVE

## 2015-03-22 LAB — OB RESULTS CONSOLE GC/CHLAMYDIA
Chlamydia: NEGATIVE
GC PROBE AMP, GENITAL: NEGATIVE

## 2015-03-22 LAB — OB RESULTS CONSOLE HIV ANTIBODY (ROUTINE TESTING): HIV: NONREACTIVE

## 2015-03-22 LAB — OB RESULTS CONSOLE RUBELLA ANTIBODY, IGM: Rubella: IMMUNE

## 2015-08-01 ENCOUNTER — Other Ambulatory Visit: Payer: Self-pay | Admitting: Obstetrics and Gynecology

## 2015-09-10 LAB — OB RESULTS CONSOLE GBS: GBS: POSITIVE

## 2015-09-19 ENCOUNTER — Other Ambulatory Visit (HOSPITAL_COMMUNITY): Payer: Self-pay | Admitting: Obstetrics and Gynecology

## 2015-09-19 DIAGNOSIS — Z3A36 36 weeks gestation of pregnancy: Secondary | ICD-10-CM

## 2015-09-19 DIAGNOSIS — Z3689 Encounter for other specified antenatal screening: Secondary | ICD-10-CM

## 2015-09-19 DIAGNOSIS — O365930 Maternal care for other known or suspected poor fetal growth, third trimester, not applicable or unspecified: Secondary | ICD-10-CM

## 2015-09-19 DIAGNOSIS — O365933 Maternal care for other known or suspected poor fetal growth, third trimester, fetus 3: Secondary | ICD-10-CM

## 2015-09-20 ENCOUNTER — Encounter (HOSPITAL_COMMUNITY): Payer: Self-pay

## 2015-09-20 ENCOUNTER — Ambulatory Visit (HOSPITAL_COMMUNITY)
Admission: RE | Admit: 2015-09-20 | Discharge: 2015-09-20 | Disposition: A | Discharge status: Home / Self Care | Payer: 59 | Source: Ambulatory Visit | Attending: Obstetrics and Gynecology | Admitting: Obstetrics and Gynecology

## 2015-09-20 ENCOUNTER — Other Ambulatory Visit (HOSPITAL_COMMUNITY): Payer: Self-pay | Admitting: Obstetrics and Gynecology

## 2015-09-20 DIAGNOSIS — O365930 Maternal care for other known or suspected poor fetal growth, third trimester, not applicable or unspecified: Secondary | ICD-10-CM

## 2015-09-20 DIAGNOSIS — Z3A36 36 weeks gestation of pregnancy: Secondary | ICD-10-CM | POA: Insufficient documentation

## 2015-09-20 DIAGNOSIS — Z3689 Encounter for other specified antenatal screening: Secondary | ICD-10-CM

## 2015-09-20 DIAGNOSIS — O09293 Supervision of pregnancy with other poor reproductive or obstetric history, third trimester: Secondary | ICD-10-CM | POA: Diagnosis present

## 2015-09-20 DIAGNOSIS — Z36 Encounter for antenatal screening of mother: Secondary | ICD-10-CM | POA: Insufficient documentation

## 2015-09-20 DIAGNOSIS — O10013 Pre-existing essential hypertension complicating pregnancy, third trimester: Secondary | ICD-10-CM | POA: Diagnosis not present

## 2015-09-26 ENCOUNTER — Encounter (HOSPITAL_COMMUNITY): Payer: Self-pay

## 2015-09-28 ENCOUNTER — Telehealth (HOSPITAL_COMMUNITY): Payer: Self-pay | Admitting: *Deleted

## 2015-09-28 NOTE — Telephone Encounter (Signed)
Preadmission screen  

## 2015-10-01 ENCOUNTER — Encounter (HOSPITAL_COMMUNITY): Payer: Self-pay

## 2015-10-04 ENCOUNTER — Encounter (HOSPITAL_COMMUNITY)
Admission: RE | Admit: 2015-10-04 | Discharge: 2015-10-04 | Disposition: A | Discharge status: Home / Self Care | Payer: Commercial Managed Care - PPO | Source: Ambulatory Visit | Attending: Obstetrics and Gynecology | Admitting: Obstetrics and Gynecology

## 2015-10-04 HISTORY — DX: Personal history of other infectious and parasitic diseases: Z86.19

## 2015-10-04 LAB — CBC
HCT: 35.6 % — ABNORMAL LOW (ref 36.0–46.0)
Hemoglobin: 12.5 g/dL (ref 12.0–15.0)
MCH: 30 pg (ref 26.0–34.0)
MCHC: 35.1 g/dL (ref 30.0–36.0)
MCV: 85.6 fL (ref 78.0–100.0)
PLATELETS: 188 10*3/uL (ref 150–400)
RBC: 4.16 MIL/uL (ref 3.87–5.11)
RDW: 13.3 % (ref 11.5–15.5)
WBC: 7.9 10*3/uL (ref 4.0–10.5)

## 2015-10-04 LAB — TYPE AND SCREEN
ABO/RH(D): O POS
Antibody Screen: NEGATIVE

## 2015-10-04 NOTE — Patient Instructions (Signed)
20 Lavenia AtlasJennifer L Krol  10/04/2015   Your procedure is scheduled on:  10/05/2015  Enter through the Main Entrance of [facility] at 0850 AM.  Pick up the phone at the desk and dial [phone].   Call this number if you have problems the morning of surgery: [phone]   Remember:   Do not eat food:After Midnight.  Do not drink clear liquids: After Midnight.  Take these medicines the morning of surgery with A SIP OF WATER: none   Do not wear jewelry, make-up or nail polish.  Do not wear lotions, powders, or perfumes. You may wear deodorant.  Do not shave 48 hours prior to surgery.  Do not bring valuables to the hospital.  [facility] is not   responsible for any belongings or valuables brought to the hospital.  Contacts, dentures or bridgework may not be worn into surgery.  Leave suitcase in the car. After surgery it may be brought to your room.  For patients admitted to the hospital, checkout time is 11:00 AM the day of              discharge.   Patients discharged the day of surgery will not be allowed to drive             home.  Name and phone number of your driver: na  Special Instructions:   N/A   Please read over the following fact sheets that you were given:   Surgical Site Infection Prevention

## 2015-10-05 ENCOUNTER — Encounter (HOSPITAL_COMMUNITY)
Admission: RE | Disposition: A | Discharge status: Home / Self Care | Payer: Self-pay | Source: Ambulatory Visit | Attending: Obstetrics and Gynecology

## 2015-10-05 ENCOUNTER — Other Ambulatory Visit: Payer: Self-pay | Admitting: Obstetrics and Gynecology

## 2015-10-05 ENCOUNTER — Inpatient Hospital Stay (HOSPITAL_COMMUNITY): Payer: Commercial Managed Care - PPO | Admitting: Anesthesiology

## 2015-10-05 ENCOUNTER — Encounter (HOSPITAL_COMMUNITY): Payer: Self-pay | Admitting: *Deleted

## 2015-10-05 ENCOUNTER — Inpatient Hospital Stay (HOSPITAL_COMMUNITY)
Admission: RE | Admit: 2015-10-05 | Discharge: 2015-10-07 | DRG: 765 | Disposition: A | Discharge status: Home / Self Care | Payer: Commercial Managed Care - PPO | Source: Ambulatory Visit | Attending: Obstetrics and Gynecology | Admitting: Obstetrics and Gynecology

## 2015-10-05 DIAGNOSIS — E669 Obesity, unspecified: Secondary | ICD-10-CM | POA: Diagnosis present

## 2015-10-05 DIAGNOSIS — O99824 Streptococcus B carrier state complicating childbirth: Secondary | ICD-10-CM | POA: Diagnosis present

## 2015-10-05 DIAGNOSIS — O99214 Obesity complicating childbirth: Secondary | ICD-10-CM | POA: Diagnosis present

## 2015-10-05 DIAGNOSIS — O1002 Pre-existing essential hypertension complicating childbirth: Secondary | ICD-10-CM | POA: Diagnosis present

## 2015-10-05 DIAGNOSIS — Z6833 Body mass index (BMI) 33.0-33.9, adult: Secondary | ICD-10-CM

## 2015-10-05 DIAGNOSIS — Z3A38 38 weeks gestation of pregnancy: Secondary | ICD-10-CM

## 2015-10-05 DIAGNOSIS — O365930 Maternal care for other known or suspected poor fetal growth, third trimester, not applicable or unspecified: Secondary | ICD-10-CM | POA: Diagnosis present

## 2015-10-05 DIAGNOSIS — O34211 Maternal care for low transverse scar from previous cesarean delivery: Principal | ICD-10-CM | POA: Diagnosis present

## 2015-10-05 LAB — RPR: RPR: NONREACTIVE

## 2015-10-05 SURGERY — Surgical Case
Anesthesia: Spinal

## 2015-10-05 MED ORDER — PROMETHAZINE HCL 25 MG/ML IJ SOLN: 6.2500 mg | INTRAMUSCULAR | Status: DC | PRN

## 2015-10-05 MED ORDER — BUPIVACAINE IN DEXTROSE 0.75-8.25 % IT SOLN
INTRATHECAL | Status: DC | PRN
  Administered 2015-10-05: 1.7 mL via INTRATHECAL

## 2015-10-05 MED ORDER — MEPERIDINE HCL 25 MG/ML IJ SOLN: 6.2500 mg | INTRAMUSCULAR | Status: DC | PRN

## 2015-10-05 MED ORDER — SODIUM CHLORIDE 0.9% FLUSH: 3.0000 mL | INTRAVENOUS | Status: DC | PRN

## 2015-10-05 MED ORDER — DIPHENHYDRAMINE HCL 25 MG PO CAPS: 25.0000 mg | ORAL_CAPSULE | ORAL | Status: DC | PRN

## 2015-10-05 MED ORDER — KETOROLAC TROMETHAMINE 30 MG/ML IJ SOLN
INTRAMUSCULAR | Status: AC
  Filled 2015-10-05: qty 1

## 2015-10-05 MED ORDER — TETANUS-DIPHTH-ACELL PERTUSSIS 5-2.5-18.5 LF-MCG/0.5 IM SUSP: 0.5000 mL | Freq: Once | INTRAMUSCULAR | Status: DC

## 2015-10-05 MED ORDER — SCOPOLAMINE 1 MG/3DAYS TD PT72
MEDICATED_PATCH | TRANSDERMAL | Status: AC
  Administered 2015-10-05: 1.5 mg via TRANSDERMAL
  Filled 2015-10-05: qty 1

## 2015-10-05 MED ORDER — MORPHINE SULFATE (PF) 0.5 MG/ML IJ SOLN
INTRAMUSCULAR | Status: AC
  Filled 2015-10-05: qty 10

## 2015-10-05 MED ORDER — FENTANYL CITRATE (PF) 100 MCG/2ML IJ SOLN
INTRAMUSCULAR | Status: AC
  Filled 2015-10-05: qty 2

## 2015-10-05 MED ORDER — LACTATED RINGERS IV SOLN
INTRAVENOUS | Status: DC
  Administered 2015-10-05 – 2015-10-06 (×2): via INTRAVENOUS

## 2015-10-05 MED ORDER — SODIUM CHLORIDE 0.9 % IR SOLN
Status: DC | PRN
  Administered 2015-10-05: 1

## 2015-10-05 MED ORDER — ONDANSETRON HCL 4 MG/2ML IJ SOLN: 4.0000 mg | Freq: Three times a day (TID) | INTRAMUSCULAR | Status: DC | PRN

## 2015-10-05 MED ORDER — PHENYLEPHRINE 8 MG IN D5W 100 ML (0.08MG/ML) PREMIX OPTIME
INJECTION | INTRAVENOUS | Status: AC
  Filled 2015-10-05: qty 100

## 2015-10-05 MED ORDER — ZOLPIDEM TARTRATE 5 MG PO TABS: 5.0000 mg | ORAL_TABLET | Freq: Every evening | ORAL | Status: DC | PRN

## 2015-10-05 MED ORDER — OXYTOCIN 40 UNITS IN LACTATED RINGERS INFUSION - SIMPLE MED: 2.5000 [IU]/h | INTRAVENOUS | Status: AC

## 2015-10-05 MED ORDER — SCOPOLAMINE 1 MG/3DAYS TD PT72
1.0000 | MEDICATED_PATCH | Freq: Once | TRANSDERMAL | Status: DC
  Administered 2015-10-05: 1.5 mg via TRANSDERMAL

## 2015-10-05 MED ORDER — NALBUPHINE HCL 10 MG/ML IJ SOLN: 5.0000 mg | Freq: Once | INTRAMUSCULAR | Status: DC | PRN

## 2015-10-05 MED ORDER — PHENYLEPHRINE 8 MG IN D5W 100 ML (0.08MG/ML) PREMIX OPTIME
INJECTION | INTRAVENOUS | Status: DC | PRN
  Administered 2015-10-05: 60 ug/min via INTRAVENOUS

## 2015-10-05 MED ORDER — NALOXONE HCL 2 MG/2ML IJ SOSY: 1.0000 ug/kg/h | PREFILLED_SYRINGE | INTRAVENOUS | Status: DC | PRN

## 2015-10-05 MED ORDER — FENTANYL CITRATE (PF) 100 MCG/2ML IJ SOLN
INTRAMUSCULAR | Status: DC | PRN
  Administered 2015-10-05: 10 ug via INTRATHECAL

## 2015-10-05 MED ORDER — WITCH HAZEL-GLYCERIN EX PADS: 1.0000 "application " | MEDICATED_PAD | CUTANEOUS | Status: DC | PRN

## 2015-10-05 MED ORDER — ONDANSETRON HCL 4 MG/2ML IJ SOLN
INTRAMUSCULAR | Status: AC
  Filled 2015-10-05: qty 2

## 2015-10-05 MED ORDER — ACETAMINOPHEN 500 MG PO TABS
1000.0000 mg | ORAL_TABLET | Freq: Four times a day (QID) | ORAL | Status: AC
  Administered 2015-10-05 – 2015-10-06 (×3): 1000 mg via ORAL
  Filled 2015-10-05 (×3): qty 2

## 2015-10-05 MED ORDER — ONDANSETRON HCL 4 MG/2ML IJ SOLN
INTRAMUSCULAR | Status: DC | PRN
  Administered 2015-10-05: 4 mg via INTRAVENOUS

## 2015-10-05 MED ORDER — PRENATAL MULTIVITAMIN CH
1.0000 | ORAL_TABLET | Freq: Every day | ORAL | Status: DC
  Administered 2015-10-06 – 2015-10-07 (×2): 1 via ORAL
  Filled 2015-10-05 (×2): qty 1

## 2015-10-05 MED ORDER — NALOXONE HCL 0.4 MG/ML IJ SOLN: 0.4000 mg | INTRAMUSCULAR | Status: DC | PRN

## 2015-10-05 MED ORDER — MORPHINE SULFATE (PF) 0.5 MG/ML IJ SOLN
INTRAMUSCULAR | Status: DC | PRN
  Administered 2015-10-05: .2 mg via INTRATHECAL

## 2015-10-05 MED ORDER — LACTATED RINGERS IV SOLN
INTRAVENOUS | Status: DC
  Administered 2015-10-05: 11:00:00 via INTRAVENOUS

## 2015-10-05 MED ORDER — SIMETHICONE 80 MG PO CHEW
80.0000 mg | CHEWABLE_TABLET | Freq: Three times a day (TID) | ORAL | Status: DC
  Administered 2015-10-05 – 2015-10-07 (×5): 80 mg via ORAL
  Filled 2015-10-05 (×5): qty 1

## 2015-10-05 MED ORDER — IBUPROFEN 600 MG PO TABS
600.0000 mg | ORAL_TABLET | Freq: Four times a day (QID) | ORAL | Status: DC
  Administered 2015-10-05 – 2015-10-07 (×8): 600 mg via ORAL
  Filled 2015-10-05 (×8): qty 1

## 2015-10-05 MED ORDER — NALBUPHINE HCL 10 MG/ML IJ SOLN: 5.0000 mg | INTRAMUSCULAR | Status: DC | PRN

## 2015-10-05 MED ORDER — SIMETHICONE 80 MG PO CHEW: 80.0000 mg | CHEWABLE_TABLET | ORAL | Status: DC | PRN

## 2015-10-05 MED ORDER — MENTHOL 3 MG MT LOZG: 1.0000 | LOZENGE | OROMUCOSAL | Status: DC | PRN

## 2015-10-05 MED ORDER — SENNOSIDES-DOCUSATE SODIUM 8.6-50 MG PO TABS
2.0000 | ORAL_TABLET | ORAL | Status: DC
  Administered 2015-10-05 – 2015-10-06 (×2): 2 via ORAL
  Filled 2015-10-05 (×2): qty 2

## 2015-10-05 MED ORDER — DIBUCAINE 1 % RE OINT: 1.0000 "application " | TOPICAL_OINTMENT | RECTAL | Status: DC | PRN

## 2015-10-05 MED ORDER — KETOROLAC TROMETHAMINE 30 MG/ML IJ SOLN: 30.0000 mg | Freq: Four times a day (QID) | INTRAMUSCULAR | Status: AC | PRN

## 2015-10-05 MED ORDER — DIPHENHYDRAMINE HCL 25 MG PO CAPS: 25.0000 mg | ORAL_CAPSULE | Freq: Four times a day (QID) | ORAL | Status: DC | PRN

## 2015-10-05 MED ORDER — COCONUT OIL OIL: 1.0000 "application " | TOPICAL_OIL | Status: DC | PRN

## 2015-10-05 MED ORDER — CEFAZOLIN SODIUM-DEXTROSE 2-3 GM-% IV SOLR
INTRAVENOUS | Status: DC | PRN
  Administered 2015-10-05: 2 g via INTRAVENOUS

## 2015-10-05 MED ORDER — DIPHENHYDRAMINE HCL 50 MG/ML IJ SOLN: 12.5000 mg | INTRAMUSCULAR | Status: DC | PRN

## 2015-10-05 MED ORDER — OXYTOCIN 10 UNIT/ML IJ SOLN
40.0000 [IU] | INTRAMUSCULAR | Status: DC | PRN
  Administered 2015-10-05: 40 [IU] via INTRAVENOUS

## 2015-10-05 MED ORDER — FENTANYL CITRATE (PF) 100 MCG/2ML IJ SOLN: 25.0000 ug | INTRAMUSCULAR | Status: DC | PRN

## 2015-10-05 MED ORDER — KETOROLAC TROMETHAMINE 30 MG/ML IJ SOLN
30.0000 mg | Freq: Four times a day (QID) | INTRAMUSCULAR | Status: AC | PRN
  Administered 2015-10-05: 30 mg via INTRAMUSCULAR

## 2015-10-05 MED ORDER — ACETAMINOPHEN 325 MG PO TABS
650.0000 mg | ORAL_TABLET | ORAL | Status: DC | PRN
  Administered 2015-10-06: 650 mg via ORAL
  Filled 2015-10-05: qty 2

## 2015-10-05 MED ORDER — OXYTOCIN 10 UNIT/ML IJ SOLN
INTRAMUSCULAR | Status: AC
  Filled 2015-10-05: qty 4

## 2015-10-05 MED ORDER — SIMETHICONE 80 MG PO CHEW
80.0000 mg | CHEWABLE_TABLET | ORAL | Status: DC
  Administered 2015-10-05 – 2015-10-06 (×2): 80 mg via ORAL
  Filled 2015-10-05 (×2): qty 1

## 2015-10-05 MED ORDER — LACTATED RINGERS IV SOLN
Freq: Once | INTRAVENOUS | Status: AC
  Administered 2015-10-05 (×2): via INTRAVENOUS

## 2015-10-05 SURGICAL SUPPLY — 31 items
CLAMP CORD UMBIL (MISCELLANEOUS) IMPLANT
CLOTH BEACON ORANGE TIMEOUT ST (SAFETY) ×3 IMPLANT
DRSG OPSITE POSTOP 4X10 (GAUZE/BANDAGES/DRESSINGS) ×3 IMPLANT
DURAPREP 26ML APPLICATOR (WOUND CARE) ×3 IMPLANT
ELECT REM PT RETURN 9FT ADLT (ELECTROSURGICAL) ×3
ELECTRODE REM PT RTRN 9FT ADLT (ELECTROSURGICAL) ×1 IMPLANT
EXTRACTOR VACUUM M CUP 4 TUBE (SUCTIONS) IMPLANT
EXTRACTOR VACUUM M CUP 4' TUBE (SUCTIONS)
GLOVE BIO SURGEON STRL SZ7 (GLOVE) ×3 IMPLANT
GLOVE BIOGEL PI IND STRL 7.0 (GLOVE) ×1 IMPLANT
GLOVE BIOGEL PI INDICATOR 7.0 (GLOVE) ×2
GOWN STRL REUS W/TWL LRG LVL3 (GOWN DISPOSABLE) ×6 IMPLANT
KIT ABG SYR 3ML LUER SLIP (SYRINGE) IMPLANT
LIQUID BAND (GAUZE/BANDAGES/DRESSINGS) ×2 IMPLANT
NDL HYPO 25X5/8 SAFETYGLIDE (NEEDLE) IMPLANT
NEEDLE HYPO 22GX1.5 SAFETY (NEEDLE) IMPLANT
NEEDLE HYPO 25X5/8 SAFETYGLIDE (NEEDLE) IMPLANT
NS IRRIG 1000ML POUR BTL (IV SOLUTION) ×3 IMPLANT
PACK C SECTION WH (CUSTOM PROCEDURE TRAY) ×3 IMPLANT
PAD OB MATERNITY 4.3X12.25 (PERSONAL CARE ITEMS) ×3 IMPLANT
PENCIL SMOKE EVAC W/HOLSTER (ELECTROSURGICAL) ×3 IMPLANT
RTRCTR C-SECT PINK 25CM LRG (MISCELLANEOUS) ×3 IMPLANT
SUT CHROMIC 1 CTX 36 (SUTURE) ×6 IMPLANT
SUT CHROMIC 2 0 CT 1 (SUTURE) ×3 IMPLANT
SUT PDS AB 0 CTX 60 (SUTURE) ×3 IMPLANT
SUT VIC AB 2-0 CT1 27 (SUTURE) ×3
SUT VIC AB 2-0 CT1 TAPERPNT 27 (SUTURE) ×1 IMPLANT
SUT VIC AB 4-0 KS 27 (SUTURE) IMPLANT
SYR 30ML LL (SYRINGE) IMPLANT
TOWEL OR 17X24 6PK STRL BLUE (TOWEL DISPOSABLE) ×3 IMPLANT
TRAY FOLEY CATH SILVER 14FR (SET/KITS/TRAYS/PACK) ×3 IMPLANT

## 2015-10-05 NOTE — Lactation Note (Signed)
This note was copied from a baby's chart. Lactation Consultation Note  Patient Name: Nancy Rice ZOXWR'UToday's Date: 10/05/2015 Reason for consult: Initial assessment Baby at 6hr of life. Mom stated she had bf well x2 since birth. Her oldest bf and formula fed for 151m until her supply completely dried up. She struggled with latch with her 2nd child and was not able to pump "that much" so she stopped at 3wk. She used a NS with both of her other children. Mom has large compressible breast with very short shaft to flat nipples. Baby was cueing and placed in the football position but spit up then went to sleep. Discussed baby behavior, feeding frequency, baby belly size, voids, wt loss, breast changes, and nipple care. Offered DEBP or Harmony but mom declined at this time, she wishes to latch baby. She stated she can manually express and has spoon in room. Given lactation handouts. Aware of OP services and support group.    Maternal Data Formula Feeding for Exclusion: No Has patient been taught Hand Expression?: Yes Does the patient have breastfeeding experience prior to this delivery?: Yes  Feeding Feeding Type: Breast Fed Length of feed: 0 min  LATCH Score/Interventions Latch: Too sleepy or reluctant, no latch achieved, no sucking elicited. Intervention(s): Skin to skin;Teach feeding cues;Waking techniques  Audible Swallowing: None Intervention(s): Skin to skin;Hand expression  Type of Nipple: Flat  Comfort (Breast/Nipple): Soft / non-tender     Hold (Positioning): Full assist, staff holds infant at breast Intervention(s): Support Pillows;Position options  LATCH Score: 3  Lactation Tools Discussed/Used WIC Program: No   Consult Status Consult Status: Follow-up Date: 10/06/15 Follow-up type: In-patient    Rulon Eisenmengerlizabeth E Tiena Manansala 10/05/2015, 5:22 PM

## 2015-10-05 NOTE — H&P (Signed)
Nancy Rice is a 35 y.o. female presenting for repeat cesarean section  35 yo G3P2002 @ 38+5 presents for repeat cesarean. Her pregnancy has been complicated by IUGR that was noted at 35/36 weeks. EFW has been less than 10% with normal dopplers. She has a Rice/o chronic hypertension but has not been on BP meds this pregnancy  History OB History    Gravida Para Term Preterm AB TAB SAB Ectopic Multiple Living   3 2 2  0 0 0 0 0 0 2     Past Medical History  Diagnosis Date  . Hypertension   . Hx of varicella    Past Surgical History  Procedure Laterality Date  . Cesarean section  10/07/2011    Procedure: CESAREAN SECTION;  Surgeon: Freddrick MarchKendra Rice. Tenny Crawoss, MD;  Location: [facility];  Service: Gynecology;  Laterality: N/A;  . Cesarean section N/A 01/18/2013    Procedure: REPEAT CESAREAN SECTION;  Surgeon: Freddrick MarchKendra Rice. Tenny Crawoss, MD;  Location: [facility];  Service: Obstetrics;  Laterality: N/A;   Family History: family history includes Cancer in her father; Diabetes in her paternal grandmother; Heart disease in her maternal grandmother and paternal grandfather; Hypertension in her father and mother. Social History:  reports that she has never smoked. She has never used smokeless tobacco. She reports that she does not drink alcohol or use illicit drugs.   Prenatal Transfer Tool  Maternal Diabetes: No Genetic Screening: Declined Maternal Ultrasounds/Referrals: Normal Fetal Ultrasounds or other Referrals:  Referred to Materal Fetal Medicine  IUGR Maternal Substance Abuse:  No Significant Maternal Medications:  None Significant Maternal Lab Results:  Sickle cell trait Other Comments:  None  ROS   Exam Physical Exam  Prenatal labs: ABO, Rh: --/--/O POS (07/06 1100) Antibody: NEG (07/06 1100) Rubella: Immune (12/22 0000) RPR: Non Reactive (07/06 1100)  HBsAg: Negative (12/22 0000)  HIV: Non-reactive (12/22 0000)  GBS: Positive (06/12 0000)   Assessment/Plan: 1) Admit 2) SCDs  3) Ancef OCTOR 4)  Proceed with repeat cesarean section   Nancy Rice. 10/05/2015, 8:56 AM

## 2015-10-05 NOTE — Op Note (Addendum)
Pre-Operative Diagnosis: 1) 38+5 week intrauterine pregnancy 2) intrauterine growth restriction 3) history of prior cesarean section,desires repeat Postoperative Diagnosis: 1) 38+5 week intrauterine pregnancy 2) intrauterine growth restriction 3) history of prior cesarean section,desires repeat Procedure: repeat low transverse cesarean section Surgeon: Dr. Waynard ReedsKendra Merlen Gurry Assistant:Nancy Rice, RFNA Operative Findings: Vigorous female infant in the vertex presentation with Apgar scores of 8 at 1 minute and 9 at 5 minutes. Normal appearing ovaries and tubes bilaterally. Specimen: placenta to L&D for disposal EBL: Total I/O In: 2700 [I.V.:2700] Out: 900 [Urine:300; Blood:600]   Procedure:[name] is an 35 year old gravida 3 para 2002 at 6038 weeks and 5 days estimated gestational age who presents for cesarean section. The patient's pregnancy has been complicated by intrauterine growth restriction which was first encountered at 36 weeks Estimated fetal weight was less than 10th percentile. Her maternal fetal medicine recommendations the patient is to be delivered between 38 and 39 weeks. The patient has a history of chronic hypertension for which she was on antihypertensive medications and her prior 2 pregnancies. This pregnancy she has been off all antihypertensives.She was only noted to have mildly elevated blood pressures at her prenatal visit yesterday. Following the appropriate informed consent the patient was brought to the operating room where spinal anesthesia was administered and found to be adequate. She was placed in the dorsal supine position with a leftward tilt. She was prepped and draped in the normal sterile fashion. THe patient was appropriately identified in a preoperative timeout procedure. Scalpel was then used to make a Pfannenstiel skin incision which was carried down to the underlying layers of soft tissue to the fascia. The fascia was incised in the midline and the fascial incision was  extended laterally with Mayo scissors. The superior aspect of the fascial incision was grasped with Coker clamps x2, tented up and the rectus muscles dissected off sharply with the electrocautery unit area and the same procedure was repeated on the inferior aspect of the fascial incision. The rectus muscles were separated in the midline. The abdominal peritoneum was identified, tented up, entered sharply, and the incision was extended superiorly and inferiorly with good visualization of the bladder. The Alexis retractor was then deployed. The vesicouterine peritoneum was identified, tented up, entered sharply, and the bladder flap was created digitally. Scalpel was then used to make a low transverse incision on the uterus which was extended laterally with both blunt dissection and the bandage scissors. The fetal vertex was identified, delivered easily through the uterine incision followed by the body. The infant was bulb suctioned on the operative field cried vigorously, the cord was clamped and cut after a 1 minute delay and the infant was passed to the waiting neonatologist. Placenta was then delivered spontaneously, the uterus was cleared of all clot and debris. The uterine incision was repaired with #1 chromic in running locked fashion . Ovaries and tubes were inspected and normal. The Alexis retractor was removed. The abdominal peritoneum was reapproximated with 2-0 Vicryl in a running fashion, the rectus muscles was reapproximated with 2-0 chromic in a running fashion. The fascia was closed with a looped PDS in a running fashion. The skin was closed with 4-0 vicryl in a subcuticular fashion and surgical skin glue. All sponge lap and needle counts were correct x2. Patient tolerated the procedure well and recovered in stable condition following the procedure.

## 2015-10-05 NOTE — Anesthesia Preprocedure Evaluation (Addendum)
Anesthesia Evaluation  Patient identified by MRN, date of birth, ID band Patient awake    Reviewed: Allergy & Precautions, NPO status , Patient's Chart, lab work & pertinent test results  Airway Mallampati: II  TM Distance: >3 FB Neck ROM: Full    Dental  (+) Teeth Intact, Dental Advisory Given   Pulmonary neg pulmonary ROS,    Pulmonary exam normal breath sounds clear to auscultation       Cardiovascular hypertension (no meds), Normal cardiovascular exam Rhythm:Regular Rate:Normal     Neuro/Psych negative neurological ROS  negative psych ROS   GI/Hepatic negative GI ROS, Neg liver ROS,   Endo/Other  Obesity   Renal/GU negative Renal ROS     Musculoskeletal negative musculoskeletal ROS (+)   Abdominal   Peds  Hematology negative hematology ROS (+) Plt 188k    Anesthesia Other Findings Day of surgery medications reviewed with the patient.  Reproductive/Obstetrics (+) Pregnancy H/o 2 previous c-sections                            Anesthesia Physical Anesthesia Plan  ASA: II  Anesthesia Plan: Spinal   Post-op Pain Management:    Induction:   Airway Management Planned:   Additional Equipment:   Intra-op Plan:   Post-operative Plan:   Informed Consent: I have reviewed the patients History and Physical, chart, labs and discussed the procedure including the risks, benefits and alternatives for the proposed anesthesia with the patient or authorized representative who has indicated his/her understanding and acceptance.   Dental advisory given  Plan Discussed with: CRNA, Anesthesiologist and Surgeon  Anesthesia Plan Comments: (Discussed risks and benefits of and differences between spinal and general. Discussed risks of spinal including headache, backache, failure, bleeding, infection, and nerve damage. Patient consents to spinal. Questions answered. Coagulation studies and platelet  count acceptable.)        Anesthesia Quick Evaluation

## 2015-10-05 NOTE — Anesthesia Postprocedure Evaluation (Signed)
Anesthesia Post Note  Patient: Nancy Rice  Procedure(s) Performed: Procedure(s) (LRB): REPEAT CESAREAN SECTION (N/A)  Patient location during evaluation: PACU Anesthesia Type: Spinal Level of consciousness: oriented and awake and alert Pain management: pain level controlled Vital Signs Assessment: post-procedure vital signs reviewed and stable Respiratory status: spontaneous breathing, respiratory function stable and patient connected to nasal cannula oxygen Cardiovascular status: blood pressure returned to baseline and stable Postop Assessment: no headache, no backache, spinal receding, patient able to bend at knees and no signs of nausea or vomiting Anesthetic complications: no     Last Vitals:  Filed Vitals:   10/05/15 1215 10/05/15 1230  BP: 132/82 141/81  Pulse: 45 46  Temp:  36.4 C  Resp: 17 15    Last Pain: There were no vitals filed for this visit. Pain Goal: Patients Stated Pain Goal: 0 (10/05/15 1230)               Cecile HearingStephen Edward Noele Icenhour

## 2015-10-05 NOTE — Transfer of Care (Signed)
Immediate Anesthesia Transfer of Care Note  Patient: Nancy Rice  Procedure(s) Performed: Procedure(s) with comments: REPEAT CESAREAN SECTION (N/A) - Tracey T. to RNFA- confirmed  Patient Location: PACU  Anesthesia Type:Spinal  Level of Consciousness: awake, alert  and oriented  Airway & Oxygen Therapy: Patient Spontanous Breathing  Post-op Assessment: Report given to RN and Post -op Vital signs reviewed and stable  Post vital signs: Reviewed and stable  Last Vitals:  Filed Vitals:   10/05/15 0905 10/05/15 0935  BP: 175/107 151/101  Pulse: 61 68  Temp: 36.6 C   Resp: 16     Last Pain: There were no vitals filed for this visit.    Patients Stated Pain Goal: 4 (10/05/15 0919)  Complications: No apparent anesthesia complications

## 2015-10-05 NOTE — Anesthesia Procedure Notes (Signed)
Spinal Patient location during procedure: OR Staffing Anesthesiologist: Virdie Penning EDWARD Performed by: anesthesiologist  Preanesthetic Checklist Completed: patient identified, surgical consent, pre-op evaluation, timeout performed, IV checked, risks and benefits discussed and monitors and equipment checked Spinal Block Patient position: sitting Prep: site prepped and draped and DuraPrep Patient monitoring: continuous pulse ox and blood pressure Approach: midline Location: L3-4 Needle Needle type: Pencan  Needle gauge: 24 G Needle length: 9 cm Assessment Sensory level: T4 Additional Notes Functioning IV was confirmed and monitors were applied. Sterile prep and drape, including hand hygiene, mask and sterile gloves were used. The patient was positioned and the spine was prepped. The skin was anesthetized with lidocaine.  Free flow of clear CSF was obtained prior to injecting local anesthetic into the CSF.  The spinal needle aspirated freely following injection.  The needle was carefully withdrawn.  The patient tolerated the procedure well. Consent was obtained prior to procedure with all questions answered and concerns addressed. Risks including but not limited to bleeding, infection, nerve damage, paralysis, failed block, inadequate analgesia, allergic reaction, high spinal, itching and headache were discussed and the patient wished to proceed.   Romaine Maciolek, MD   

## 2015-10-05 NOTE — Consult Note (Signed)
Neonatology Note:   Attendance at C-section:    I was asked by Dr. Ross to attend this repeat C/S at term. The mother is a G35 yo G3P2002 @ 38+5 with pregnancy complicated by IUGR that was noted at 35-[redacted] weeks EGA. EFW has been less than 10% with normal dopplers. h/o chronic hypertension for which not on meds.  GBS positive, no labor, with good prenatal care. ROM 0 hours before delivery, fluid clear. Infant vigorous with good spontaneous cry and tone. Needed only minimal bulb suctioning. Ap 8/9. Lungs clear to ausc in DR. To CN to care of Pediatrician.  Support lactation.   David C. Ehrmann, MD  

## 2015-10-06 ENCOUNTER — Encounter (HOSPITAL_COMMUNITY): Payer: Self-pay | Admitting: Obstetrics and Gynecology

## 2015-10-06 LAB — CBC
HCT: 29.5 % — ABNORMAL LOW (ref 36.0–46.0)
Hemoglobin: 10.2 g/dL — ABNORMAL LOW (ref 12.0–15.0)
MCH: 29.8 pg (ref 26.0–34.0)
MCHC: 34.6 g/dL (ref 30.0–36.0)
MCV: 86.3 fL (ref 78.0–100.0)
PLATELETS: 142 10*3/uL — AB (ref 150–400)
RBC: 3.42 MIL/uL — ABNORMAL LOW (ref 3.87–5.11)
RDW: 13.6 % (ref 11.5–15.5)
WBC: 8.2 10*3/uL (ref 4.0–10.5)

## 2015-10-06 LAB — BIRTH TISSUE RECOVERY COLLECTION (PLACENTA DONATION)

## 2015-10-06 MED ORDER — OXYCODONE-ACETAMINOPHEN 5-325 MG PO TABS
1.0000 | ORAL_TABLET | ORAL | Status: DC | PRN
  Administered 2015-10-06 – 2015-10-07 (×3): 1 via ORAL
  Filled 2015-10-06: qty 2
  Filled 2015-10-06 (×3): qty 1

## 2015-10-06 NOTE — Progress Notes (Signed)
Patient is eating, ambulating, foley in.  Pain control is good.  Appropriate lochia, no complaints. +flatus  Filed Vitals:   10/05/15 1832 10/05/15 2301 10/06/15 0230 10/06/15 0635  BP: 130/75 120/68 114/64 122/72  Pulse: 54 60 51 58  Temp:  97.8 F (36.6 C) 97.8 F (36.6 C) 97.8 F (36.6 C)  TempSrc:  Oral Oral Oral  Resp: 20 18 17 18   SpO2:  97% 96% 97%    Fundus firm Perineum without swelling. Inc: c/d/i Ext: no CT  Lab Results  Component Value Date   WBC 8.2 10/06/2015   HGB 10.2* 10/06/2015   HCT 29.5* 10/06/2015   MCV 86.3 10/06/2015   PLT 142* 10/06/2015    --/--/O POS (07/06 1100)  A/P Post op day #1  Routine care.   Claiborne Billings.    Ming Kunka, Luther ParodySIDNEY

## 2015-10-06 NOTE — Lactation Note (Signed)
This note was copied from a baby's chart. Lactation Consultation Note Mom has flat nipples and large breast. Fitted mom w/#16 NS, is what she had to use w/her other children. Gave shells to wear in bra between BF. Mom shown how to use DEBP & how to disassemble, clean, & reassemble parts.Mom knows to pump q3h for 15-20 min.Mom encouraged to waken baby for feeds. Mom encouraged to feed baby 8-12 times/24 hours and with feeding cues.  Educated about newborn behavior of 4.12lb baby. LPI information sheet given d/t low weight. Reviewed feeding guidelines. Hand expression reviewed w/no colostrum noted. Mom has edema to breast, as well in areola. Rt. Nipple more compressible than Lt. W/slow flow nipple gave Alimentum in bottle for suck training. Baby to has weak suck on gloved finger. Needed stimulation to feed w/bottle. Taught "C" hold to breast and N/S application. Reviewed feeding plan w/parents. Encouraged STS, strict I&O, and BF before formula feeding.  Patient Name: Girl Basilia JumboJennifer Pilgrim WUJWJ'XToday's Date: 10/06/2015 Reason for consult: Follow-up assessment;Infant < 6lbs;Difficult latch   Maternal Data    Feeding Feeding Type: Formula Nipple Type: Slow - flow  LATCH Score/Interventions Latch: Too sleepy or reluctant, no latch achieved, no sucking elicited. Intervention(s): Teach feeding cues;Waking techniques     Type of Nipple: Flat Intervention(s): Shells;Hand pump;Double electric pump  Comfort (Breast/Nipple): Soft / non-tender     Intervention(s): Support Pillows;Position options;Skin to skin     Lactation Tools Discussed/Used Tools: Shells;Nipple Dorris CarnesShields;Pump Nipple shield size: 16 Shell Type: Inverted Breast pump type: Double-Electric Breast Pump Pump Review: Setup, frequency, and cleaning;Milk Storage Initiated by:: Peri JeffersonL. Harpreet Pompey RN [facility] Date initiated:: 10/06/15   Consult Status Consult Status: Follow-up Date: 10/07/15 Follow-up type: In-patient    Janita Camberos, Diamond NickelLAURA  G 10/06/2015, 9:45 AM

## 2015-10-07 MED ORDER — OXYCODONE-ACETAMINOPHEN 5-325 MG PO TABS: 1.0000 | ORAL_TABLET | ORAL | Status: AC | PRN

## 2015-10-07 NOTE — Lactation Note (Signed)
This note was copied from a baby's chart. Lactation Consultation Note  Patient Name: Nancy Rice ZOXWR'UToday's Date: 10/07/2015   Follow up visit, baby 6448 hrs old.  Baby's weight down 7% today, weighing 4 lbs 11 oz.  Baby to remain a "baby patient".  Discussed with Mom and GMOB the importance of BFing baby at least every 3 hrs, and supplementing with 20-30 ml today.  Pace method of bottle feeding discussed.  Mom to pump both breasts 15 minutes on Initiation Cycle. (Mom has only pumped 2 times so far)  Discussed reasoning for this as well, due to baby being small <5 lbs.  Mom very agreeable to this plan.  Mom to feed baby any expressed colostrum using curved tip syringe.  To ask for help as needed.  Using nipple shield, and Mom states baby is feeding better now, but is sleepy at times.  To call for help with latch as needed.  LC to follow up in am.      Nancy Rice, Nancy Rice 10/07/2015, 11:26 AM

## 2015-10-07 NOTE — Discharge Summary (Signed)
Obstetric Discharge Summary Reason for Admission: cesarean section Prenatal Procedures: ultrasound Intrapartum Procedures: cesarean: low cervical, transverse Postpartum Procedures: none Complications-Operative and Postpartum: none HEMOGLOBIN  Date Value Ref Range Status  10/06/2015 10.2* 12.0 - 15.0 g/dL Final   HCT  Date Value Ref Range Status  10/06/2015 29.5* 36.0 - 46.0 % Final    Physical Exam:  General: alert and cooperative Lochia: appropriate Uterine Fundus: firm Incision: healing well, no significant drainage DVT Evaluation: No evidence of DVT seen on physical exam.  Discharge Diagnoses: Term Pregnancy-delivered  Discharge Information: Date: 10/07/2015 Activity: pelvic rest Diet: routine Medications: PNV, Ibuprofen and Percocet Condition: stable Instructions: refer to practice specific booklet Discharge to: home Follow-up Information    Follow up with Almon HerculesOSS,KENDRA H., MD In 1 week.   Specialty:  Obstetrics and Gynecology   Why:  BP and incision check   Contact information:   [address] [place] [id number] [phone]       Newborn Data: Live born female  Birth Weight: 5 lb 0.3 oz (2275 g) APGAR: 8, 9  Home with mother.  Nancy Rice, Nancy Rice 10/07/2015, 8:53 AM

## 2015-10-08 ENCOUNTER — Ambulatory Visit: Payer: Self-pay

## 2015-10-08 NOTE — Lactation Note (Signed)
This note was copied from a baby's chart. Lactation Consultation Note: Experienced BF mom reports baby just finished feeding for 30 min, Using NS and placing formula in NS to get her started. Supplementing with formula after nursing. States this baby is nursing much better than her previous babies. Baby is asleep in mom's arms. Reports she has not pumped yet today, pumped twice yesterday. Encouraged to pump 4-6 times/day to promote milk supply since using NS and little baby. Has Medela PIS for home. Weight stable last night. Good output.. #20 NS given and reviewed when to go to larger size. Reviewed our phone number, OP appointments and BFSG as resources after DC. No questions at present.To call prn  Patient Name: Nancy Basilia JumboJennifer Rice ZOXWR'UToday's Date: 10/08/2015 Reason for consult: Follow-up assessment;Infant < 6lbs   Maternal Data Formula Feeding for Exclusion: No Has patient been taught Hand Expression?: Yes Does the patient have breastfeeding experience prior to this delivery?: Yes  Feeding    LATCH Score/Interventions                      Lactation Tools Discussed/Used Tools: Nipple Shields Nipple shield size: 16;20 Breast pump type: Double-Electric Breast Pump WIC Program: No   Consult Status Consult Status: Complete    Pamelia HoitWeeks, Kaylinn Dedic D 10/08/2015, 11:17 AM

## 2023-06-01 ENCOUNTER — Other Ambulatory Visit: Payer: Self-pay | Admitting: Obstetrics and Gynecology

## 2023-06-01 DIAGNOSIS — R928 Other abnormal and inconclusive findings on diagnostic imaging of breast: Secondary | ICD-10-CM

## 2023-06-11 ENCOUNTER — Ambulatory Visit
Admission: RE | Admit: 2023-06-11 | Discharge: 2023-06-11 | Disposition: A | Discharge status: Home / Self Care | Source: Ambulatory Visit | Attending: Obstetrics and Gynecology | Admitting: Obstetrics and Gynecology

## 2023-06-11 DIAGNOSIS — R928 Other abnormal and inconclusive findings on diagnostic imaging of breast: Secondary | ICD-10-CM

## 2023-06-12 ENCOUNTER — Other Ambulatory Visit: Payer: Self-pay | Admitting: Obstetrics and Gynecology

## 2023-06-12 DIAGNOSIS — N6489 Other specified disorders of breast: Secondary | ICD-10-CM
